# Patient Record
Sex: Male | Born: 1990 | Race: Black or African American | Hispanic: No | Marital: Single | Smoking: Former smoker
Health system: Southern US, Community
[De-identification: ages and names within clinical notes are randomized; demographics above are authoritative.]

## PROBLEM LIST (undated history)

## (undated) DIAGNOSIS — J302 Other seasonal allergic rhinitis: Secondary | ICD-10-CM

## (undated) HISTORY — PX: TONSILLECTOMY: SUR1361

---
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
## 2010-05-03 LAB — CBC
HCT: 43.5 % (ref 39.0–52.0)
MCV: 84.6 fL (ref 78.0–100.0)
Platelets: 394 10*3/uL (ref 150–400)
WBC: 10.9 10*3/uL — ABNORMAL HIGH (ref 4.0–10.5)

## 2010-05-03 LAB — URINALYSIS, ROUTINE W REFLEX MICROSCOPIC
Bilirubin Urine: NEGATIVE
Glucose, UA: NEGATIVE mg/dL
Ketones, ur: NEGATIVE mg/dL
Nitrite: NEGATIVE
pH: 6 (ref 5.0–8.0)

## 2010-05-03 LAB — BASIC METABOLIC PANEL
BUN: 11 mg/dL (ref 6–23)
CO2: 27 mEq/L (ref 19–32)
Chloride: 106 mEq/L (ref 96–112)
Potassium: 4.2 mEq/L (ref 3.5–5.1)

## 2010-09-08 ENCOUNTER — Emergency Department (INDEPENDENT_AMBULATORY_CARE_PROVIDER_SITE_OTHER): Payer: Self-pay

## 2010-09-08 ENCOUNTER — Emergency Department (HOSPITAL_BASED_OUTPATIENT_CLINIC_OR_DEPARTMENT_OTHER)
Admission: EM | Admit: 2010-09-08 | Discharge: 2010-09-08 | Disposition: A | Discharge status: Home / Self Care | Payer: Self-pay | Attending: Emergency Medicine | Admitting: Emergency Medicine

## 2010-09-08 ENCOUNTER — Encounter: Payer: Self-pay | Admitting: *Deleted

## 2010-09-08 DIAGNOSIS — M25539 Pain in unspecified wrist: Secondary | ICD-10-CM | POA: Insufficient documentation

## 2010-09-08 DIAGNOSIS — W219XXA Striking against or struck by unspecified sports equipment, initial encounter: Secondary | ICD-10-CM | POA: Insufficient documentation

## 2010-09-08 DIAGNOSIS — S63509A Unspecified sprain of unspecified wrist, initial encounter: Secondary | ICD-10-CM | POA: Insufficient documentation

## 2010-09-08 DIAGNOSIS — Y9367 Activity, basketball: Secondary | ICD-10-CM | POA: Insufficient documentation

## 2010-09-08 DIAGNOSIS — M7989 Other specified soft tissue disorders: Secondary | ICD-10-CM | POA: Insufficient documentation

## 2010-09-08 DIAGNOSIS — X58XXXA Exposure to other specified factors, initial encounter: Secondary | ICD-10-CM

## 2010-09-08 DIAGNOSIS — S63501A Unspecified sprain of right wrist, initial encounter: Secondary | ICD-10-CM

## 2010-09-08 DIAGNOSIS — J45909 Unspecified asthma, uncomplicated: Secondary | ICD-10-CM | POA: Insufficient documentation

## 2010-09-08 HISTORY — DX: Other seasonal allergic rhinitis: J30.2

## 2010-09-08 MED ORDER — KETOROLAC TROMETHAMINE 60 MG/2ML IM SOLN
60.0000 mg | Freq: Once | INTRAMUSCULAR | Status: AC
  Administered 2010-09-08: 60 mg via INTRAMUSCULAR
  Filled 2010-09-08: qty 2

## 2010-09-08 MED ORDER — NAPROXEN 500 MG PO TABS: 500.0000 mg | ORAL_TABLET | Freq: Two times a day (BID) | ORAL | Status: AC

## 2010-09-08 NOTE — ED Provider Notes (Signed)
History     CSN: [id number] Arrival date & time: 09/08/2010  1:47 PM  Chief Complaint  Patient presents with  . Wrist Pain   HPI Comments: The patient states that he was playing basketball approximately one week ago when the ball hit his hand and caused his wrist to go into hyperdorsiflexion. He admits to mild swelling, pain, worse with range of motion. There is no redness, numbness, weakness. Symptoms are ongoing and minimal improvement with Tylenol has been seen  Patient is a 20 y.o. male presenting with wrist pain. The history is provided by the patient.  Wrist Pain This is a new problem. The current episode started more than 1 week ago. The problem occurs constantly. The problem has not changed since onset.Exacerbated by: Range of motion. Relieved by: Rest, ice, Tylenol. The treatment provided mild relief.    Past Medical History  Diagnosis Date  . Asthma   . Seasonal allergies     Past Surgical History  Procedure Date  . Tonsillectomy     No family history on file.  History  Substance Use Topics  . Smoking status: Current Some Day Smoker  . Smokeless tobacco: Not on file  . Alcohol Use: Yes      Review of Systems  Constitutional: Negative for fever and chills.  Musculoskeletal: Positive for joint swelling and arthralgias.  Skin: Negative for rash.    Physical Exam  BP 145/81  Pulse 98  Temp(Src) 98.2 F (36.8 C) (Oral)  Resp 18  SpO2 100%  Physical Exam  Constitutional: He appears well-developed and well-nourished. No distress.  HENT:  Head: Normocephalic and atraumatic.  Eyes: Conjunctivae are normal. No scleral icterus.  Pulmonary/Chest: Effort normal. No respiratory distress.  Musculoskeletal: Normal range of motion. He exhibits tenderness (Right wrist with tenderness to palpation diffusely, no observable swelling, no redness. Normal flexor and extensor function of the hand on the right. Normal flexor extensor function of the fingers on the right). He  exhibits no edema.  Neurological: He is alert. Coordination normal.       Normal sensation and motor function of the right upper extremity. Normal gait, normal speech.  Skin: Skin is warm and dry. No rash noted. He is not diaphoretic.    ED Course  Procedures  MDM Patient appears well, has normal vital signs, has pain in the right wrist without appreciable swelling. Will x-ray to rule out small fractures. Suspect sprain, will have ice, elevation, immobilization with a Velcro wrist splint.  X-rays as evaluated by myself and the radiologist reveal no signs of fracture.    Vida Roller, MD 09/08/10 [phone]

## 2010-09-08 NOTE — ED Notes (Signed)
Right wrist pain x 1 week. Pain started after playing basketball.

## 2012-11-06 ENCOUNTER — Emergency Department (HOSPITAL_BASED_OUTPATIENT_CLINIC_OR_DEPARTMENT_OTHER): Payer: Self-pay

## 2012-11-06 ENCOUNTER — Emergency Department (HOSPITAL_BASED_OUTPATIENT_CLINIC_OR_DEPARTMENT_OTHER)
Admission: EM | Admit: 2012-11-06 | Discharge: 2012-11-06 | Disposition: A | Discharge status: Home / Self Care | Payer: Self-pay | Attending: Emergency Medicine | Admitting: Emergency Medicine

## 2012-11-06 ENCOUNTER — Encounter (HOSPITAL_BASED_OUTPATIENT_CLINIC_OR_DEPARTMENT_OTHER): Payer: Self-pay | Admitting: Emergency Medicine

## 2012-11-06 DIAGNOSIS — R0781 Pleurodynia: Secondary | ICD-10-CM

## 2012-11-06 DIAGNOSIS — J45909 Unspecified asthma, uncomplicated: Secondary | ICD-10-CM | POA: Insufficient documentation

## 2012-11-06 DIAGNOSIS — R071 Chest pain on breathing: Secondary | ICD-10-CM | POA: Insufficient documentation

## 2012-11-06 DIAGNOSIS — F17200 Nicotine dependence, unspecified, uncomplicated: Secondary | ICD-10-CM | POA: Insufficient documentation

## 2012-11-06 LAB — BASIC METABOLIC PANEL
CO2: 26 mEq/L (ref 19–32)
Glucose, Bld: 98 mg/dL (ref 70–99)
Potassium: 3.6 mEq/L (ref 3.5–5.1)
Sodium: 139 mEq/L (ref 135–145)

## 2012-11-06 LAB — CBC WITH DIFFERENTIAL/PLATELET
Eosinophils Absolute: 0.3 10*3/uL (ref 0.0–0.7)
Eosinophils Relative: 3 % (ref 0–5)
HCT: 39.9 % (ref 39.0–52.0)
Hemoglobin: 13.2 g/dL (ref 13.0–17.0)
Lymphs Abs: 5.3 10*3/uL — ABNORMAL HIGH (ref 0.7–4.0)
MCH: 28.6 pg (ref 26.0–34.0)
MCV: 86.6 fL (ref 78.0–100.0)
Monocytes Relative: 8 % (ref 3–12)
RBC: 4.61 MIL/uL (ref 4.22–5.81)

## 2012-11-06 MED ORDER — KETOROLAC TROMETHAMINE 30 MG/ML IJ SOLN
30.0000 mg | Freq: Once | INTRAMUSCULAR | Status: AC
  Administered 2012-11-06: 30 mg via INTRAVENOUS
  Filled 2012-11-06: qty 1

## 2012-11-06 MED ORDER — SODIUM CHLORIDE 0.9 % IV SOLN
Freq: Once | INTRAVENOUS | Status: AC
  Administered 2012-11-06: 02:00:00 via INTRAVENOUS

## 2012-11-06 MED ORDER — NAPROXEN SODIUM 220 MG PO TABS: 440.0000 mg | ORAL_TABLET | Freq: Two times a day (BID) | ORAL | Status: DC | PRN

## 2012-11-06 MED ORDER — ASPIRIN 81 MG PO CHEW
324.0000 mg | CHEWABLE_TABLET | Freq: Once | ORAL | Status: AC
  Administered 2012-11-06: 324 mg via ORAL
  Filled 2012-11-06: qty 4

## 2012-11-06 MED ORDER — GI COCKTAIL ~~LOC~~
30.0000 mL | Freq: Once | ORAL | Status: AC
  Administered 2012-11-06: 30 mL via ORAL
  Filled 2012-11-06: qty 30

## 2012-11-06 NOTE — ED Provider Notes (Signed)
CSN: [id number]     Arrival date & time 11/06/12  0058 History   First MD Initiated Contact with Patient 11/06/12 0115     Chief Complaint  Patient presents with  . Chest Pain   (Consider location/radiation/quality/duration/timing/severity/associated sxs/prior Treatment) HPI This is a 22 year old male with no significant past medical history apart from asthma and seasonal allergies. He is here with chest pain since yesterday morning. It is located in the left pectoral region. He describes it as sharp and pleuritic. It is worse with activity or deep breathing. There is no associated shortness of breath, diaphoresis or nausea. He is having some epigastric discomfort. He has taken aspirin without relief. He denies fever or, cough or other cold symptoms. He denies leg pain or swelling.  Past Medical History  Diagnosis Date  . Asthma   . Seasonal allergies    Past Surgical History  Procedure Laterality Date  . Tonsillectomy     History reviewed. No pertinent family history. History  Substance Use Topics  . Smoking status: Current Some Day Smoker  . Smokeless tobacco: Not on file  . Alcohol Use: Yes    Review of Systems  All other systems reviewed and are negative.    Allergies  Review of patient's allergies indicates no known allergies.  Home Medications  No current outpatient prescriptions on file. BP 145/76  Pulse 58  Temp(Src) 98.1 F (36.7 C) (Oral)  Resp 18  Ht 5\' 10"  (1.778 m)  Wt 301 lb (136.533 kg)  BMI 43.19 kg/m2  SpO2 100%  Physical Exam General: Well-developed, well-nourished male in no acute distress; appearance consistent with age of record HENT: normocephalic; atraumatic Eyes: pupils equal, round and reactive to light; extraocular muscles intact Neck: supple Heart: regular rate and rhythm; no murmurs, rubs or gallops Lungs: clear to auscultation bilaterally Chest: No chest wall tenderness Abdomen: soft; nondistended; epigastric tenderness; no masses  or hepatosplenomegaly; bowel sounds present Extremities: No deformity; full range of motion; pulses normal; no edema Neurologic: Awake, alert and oriented; motor function intact in all extremities and symmetric; no facial droop Skin: Warm and dry Psychiatric: Normal mood and affect    ED Course  Procedures (including critical care time)  MDM   Nursing notes and vitals signs, including pulse oximetry, reviewed.  Summary of this visit's results, reviewed by myself:  Labs:  Results for orders placed during the hospital encounter of 11/06/12 (from the past 24 hour(s))  TROPONIN I     Status: None   Collection Time    11/06/12  1:49 AM      Result Value Range   Troponin I <0.30  <0.30 ng/mL  D-DIMER, QUANTITATIVE     Status: None   Collection Time    11/06/12  1:49 AM      Result Value Range   D-Dimer, Quant <0.27  0.00 - 0.48 ug/mL-FEU  BASIC METABOLIC PANEL     Status: None   Collection Time    11/06/12  1:49 AM      Result Value Range   Sodium 139  135 - 145 mEq/L   Potassium 3.6  3.5 - 5.1 mEq/L   Chloride 102  96 - 112 mEq/L   CO2 26  19 - 32 mEq/L   Glucose, Bld 98  70 - 99 mg/dL   BUN 10  6 - 23 mg/dL   Creatinine, Ser 4.09  0.50 - 1.35 mg/dL   Calcium 9.7  8.4 - 81.1 mg/dL   GFR calc non  Af Amer >90  >90 mL/min   GFR calc Af Amer >90  >90 mL/min  CBC WITH DIFFERENTIAL     Status: Abnormal   Collection Time    11/06/12  1:49 AM      Result Value Range   WBC 10.7 (*) 4.0 - 10.5 K/uL   RBC 4.61  4.22 - 5.81 MIL/uL   Hemoglobin 13.2  13.0 - 17.0 g/dL   HCT 95.6  21.3 - 08.6 %   MCV 86.6  78.0 - 100.0 fL   MCH 28.6  26.0 - 34.0 pg   MCHC 33.1  30.0 - 36.0 g/dL   RDW 57.8  46.9 - 62.9 %   Platelets 331  150 - 400 K/uL   Neutrophils Relative % 38 (*) 43 - 77 %   Neutro Abs 4.1  1.7 - 7.7 K/uL   Lymphocytes Relative 50 (*) 12 - 46 %   Lymphs Abs 5.3 (*) 0.7 - 4.0 K/uL   Monocytes Relative 8  3 - 12 %   Monocytes Absolute 0.9  0.1 - 1.0 K/uL   Eosinophils  Relative 3  0 - 5 %   Eosinophils Absolute 0.3  0.0 - 0.7 K/uL   Basophils Relative 0  0 - 1 %   Basophils Absolute 0.0  0.0 - 0.1 K/uL    Imaging Studies: Dg Chest 2 View  11/06/2012   *RADIOLOGY REPORT*  Clinical Data: Left chest pain.  CHEST - 2 VIEW  Comparison: None available at time of study interpretation.  Findings: The cardiomediastinal silhouette is unremarkable.  The lungs are clear without pleural effusions or focal consolidations. The pulmonary vasculature is unremarkable.   Trachea projects midline and there is no pneumothorax.  The included soft tissue planes and osseous structures are unremarkable.  IMPRESSION: No acute cardiopulmonary process.   Original Report Authenticated By: Awilda Metro      EKG Interpretation:  Date & Time: 11/06/2012 1:13 AM  Rate: 56  Rhythm: sinus bradycardia and sinus arrhythmia  QRS Axis: normal  Intervals: normal  ST/T Wave abnormalities: normal  Conduction Disutrbances:none  Narrative Interpretation:   Old EKG Reviewed: none available  3:00 AM Epigastric discomfort not relieved by GI cocktail. Chest pain improved with IV Toradol. Patient has low risk factors for cardiac chest pain. History and exam were consistent with pleuritic chest pain.  Carlisle Beers Kamarrion Stfort, MD 11/06/12 0300

## 2012-11-06 NOTE — ED Notes (Signed)
C/o chest pain since Monday morning, pains started at work. Pain increases with deep breath, denies fever or cough.

## 2012-11-06 NOTE — ED Notes (Signed)
Patient transported to X-ray ambulatory with tech. 

## 2013-08-09 ENCOUNTER — Encounter (HOSPITAL_BASED_OUTPATIENT_CLINIC_OR_DEPARTMENT_OTHER): Payer: Self-pay | Admitting: Emergency Medicine

## 2013-08-09 ENCOUNTER — Emergency Department (HOSPITAL_BASED_OUTPATIENT_CLINIC_OR_DEPARTMENT_OTHER)
Admission: EM | Admit: 2013-08-09 | Discharge: 2013-08-09 | Disposition: A | Discharge status: Home / Self Care | Payer: Self-pay | Attending: Emergency Medicine | Admitting: Emergency Medicine

## 2013-08-09 DIAGNOSIS — F17200 Nicotine dependence, unspecified, uncomplicated: Secondary | ICD-10-CM | POA: Insufficient documentation

## 2013-08-09 DIAGNOSIS — M62830 Muscle spasm of back: Secondary | ICD-10-CM

## 2013-08-09 DIAGNOSIS — M5380 Other specified dorsopathies, site unspecified: Secondary | ICD-10-CM | POA: Insufficient documentation

## 2013-08-09 DIAGNOSIS — J45909 Unspecified asthma, uncomplicated: Secondary | ICD-10-CM | POA: Insufficient documentation

## 2013-08-09 DIAGNOSIS — Z791 Long term (current) use of non-steroidal anti-inflammatories (NSAID): Secondary | ICD-10-CM | POA: Insufficient documentation

## 2013-08-09 MED ORDER — IBUPROFEN 800 MG PO TABS: 800.0000 mg | ORAL_TABLET | Freq: Three times a day (TID) | ORAL | Status: DC

## 2013-08-09 MED ORDER — CYCLOBENZAPRINE HCL 10 MG PO TABS: 10.0000 mg | ORAL_TABLET | Freq: Two times a day (BID) | ORAL | Status: DC | PRN

## 2013-08-09 MED ORDER — HYDROCODONE-ACETAMINOPHEN 5-325 MG PO TABS: 1.0000 | ORAL_TABLET | ORAL | Status: DC | PRN

## 2013-08-09 NOTE — Discharge Instructions (Signed)
Back Injury Prevention Back injuries can be extremely painful and difficult to heal. After having one back injury, you are much more likely to experience another later on. It is important to learn how to avoid injuring or re-injuring your back. The following tips can help you to prevent a back injury. PHYSICAL FITNESS  Exercise regularly and try to develop good tone in your abdominal muscles. Your abdominal muscles provide a lot of the support needed by your back.  Do aerobic exercises (walking, jogging, biking, swimming) regularly.  Do exercises that increase balance and strength (tai chi, yoga) regularly. This can decrease your risk of falling and injuring your back.  Stretch before and after exercising.  Maintain a healthy weight. The more you weigh, the more stress is placed on your back. For every pound of weight, 10 times that amount of pressure is placed on the back. DIET  Talk to your caregiver about how much calcium and vitamin D you need per day. These nutrients help to prevent weakening of the bones (osteoporosis). Osteoporosis can cause broken (fractured) bones that lead to back pain.  Include good sources of calcium in your diet, such as dairy products, green, leafy vegetables, and products with calcium added (fortified).  Include good sources of vitamin D in your diet, such as milk and foods that are fortified with vitamin D.  Consider taking a nutritional supplement or a multivitamin if needed.  Stop smoking if you smoke. POSTURE  Sit and stand up straight. Avoid leaning forward when you sit or hunching over when you stand.  Choose chairs with good low back (lumbar) support.  If you work at a desk, sit close to your work so you do not need to lean over. Keep your chin tucked in. Keep your neck drawn back and elbows bent at a right angle. Your arms should look like the letter "L."  Sit high and close to the steering wheel when you drive. Add a lumbar support to your car  seat if needed.  Avoid sitting or standing in one position for too long. Take breaks to get up, stretch, and walk around at least once every hour. Take breaks if you are driving for long periods of time.  Sleep on your side with your knees slightly bent, or sleep on your back with a pillow under your knees. Do not sleep on your stomach. LIFTING, TWISTING, AND REACHING  Avoid heavy lifting, especially repetitive lifting. If you must do heavy lifting:  Stretch before lifting.  Work slowly.  Rest between lifts.  Use carts and dollies to move objects when possible.  Make several small trips instead of carrying 1 heavy load.  Ask for help when you need it.  Ask for help when moving big, awkward objects.  Follow these steps when lifting:  Stand with your feet shoulder-width apart.  Get as close to the object as you can. Do not try to pick up heavy objects that are far from your body.  Use handles or lifting straps if they are available.  Bend at your knees. Squat down, but keep your heels off the floor.  Keep your shoulders pulled back, your chin tucked in, and your back straight.  Lift the object slowly, tightening the muscles in your legs, abdomen, and buttocks. Keep the object as close to the center of your body as possible.  When you put a load down, use these same guidelines in reverse.  Do not:  Lift the object above your waist.  Twist at the waist while lifting or carrying a load. Move your feet if you need to turn, not your waist.  Bend over without bending at your knees.  Avoid reaching over your head, across a table, or for an object on a high surface. OTHER TIPS  Avoid wet floors and keep sidewalks clear of ice to prevent falls.  Do not sleep on a mattress that is too soft or too hard.  Keep items that are used frequently within easy reach.  Put heavier objects on shelves at waist level and lighter objects on lower or higher shelves.  Find ways to  decrease your stress, such as exercise, massage, or relaxation techniques. Stress can build up in your muscles. Tense muscles are more vulnerable to injury.  Seek treatment for depression or anxiety if needed. These conditions can increase your risk of developing back pain. SEEK MEDICAL CARE IF:  You injure your back.  You have questions about diet, exercise, or other ways to prevent back injuries. MAKE SURE YOU:  Understand these instructions.  Will watch your condition.  Will get help right away if you are not doing well or get worse. Document Released: 02/18/2004 Document Revised: 04/04/2011 Document Reviewed: 02/21/2011 [employer] Patient Information 2015 [employer]. This information is not intended to replace advice given to you by your health care provider. Make sure you discuss any questions you have with your health care provider.  Back Pain, Adult Low back pain is very common. About 1 in 5 people have back pain.The cause of low back pain is rarely dangerous. The pain often gets better over time.About half of people with a sudden onset of back pain feel better in just 2 weeks. About 8 in 10 people feel better by 6 weeks.  CAUSES Some common causes of back pain include:  Strain of the muscles or ligaments supporting the spine.  Wear and tear (degeneration) of the spinal discs.  Arthritis.  Direct injury to the back. DIAGNOSIS Most of the time, the direct cause of low back pain is not known.However, back pain can be treated effectively even when the exact cause of the pain is unknown.Answering your caregiver's questions about your overall health and symptoms is one of the most accurate ways to make sure the cause of your pain is not dangerous. If your caregiver needs more information, he or she may order lab work or imaging tests (X-rays or MRIs).However, even if imaging tests show changes in your back, this usually does not require surgery. HOME CARE INSTRUCTIONS For  many people, back pain returns.Since low back pain is rarely dangerous, it is often a condition that people can learn to manageon their own.   Remain active. It is stressful on the back to sit or stand in one place. Do not sit, drive, or stand in one place for more than 30 minutes at a time. Take short walks on level surfaces as soon as pain allows.Try to increase the length of time you walk each day.  Do not stay in bed.Resting more than 1 or 2 days can delay your recovery.  Do not avoid exercise or work.Your body is made to move.It is not dangerous to be active, even though your back may hurt.Your back will likely heal faster if you return to being active before your pain is gone.  Pay attention to your body when you bend and lift. Many people have less discomfortwhen lifting if they bend their knees, keep the load close to their bodies,and   twisting. Often, the most comfortable positions are those that put less stress on your recovering back.  Find a comfortable position to sleep. Use a firm mattress and lie on your side with your knees slightly bent. If you lie on your back, put a pillow under your knees.  Only take over-the-counter or prescription medicines as directed by your caregiver. Over-the-counter medicines to reduce pain and inflammation are often the most helpful.Your caregiver may prescribe muscle relaxant drugs.These medicines help dull your pain so you can more quickly return to your normal activities and healthy exercise.  Put ice on the injured area.  Put ice in a plastic bag.  Place a towel between your skin and the bag.  Leave the ice on for 15-20 minutes, 03-04 times a day for the first 2 to 3 days. After that, ice and heat may be alternated to reduce pain and spasms.  Ask your caregiver about trying back exercises and gentle massage. This may be of some benefit.  Avoid feeling anxious or stressed.Stress increases muscle tension and can worsen back  pain.It is important to recognize when you are anxious or stressed and learn ways to manage it.Exercise is a great option. SEEK MEDICAL CARE IF:  You have pain that is not relieved with rest or medicine.  You have pain that does not improve in 1 week.  You have new symptoms.  You are generally not feeling well. SEEK IMMEDIATE MEDICAL CARE IF:   You have pain that radiates from your back into your legs.  You develop new bowel or bladder control problems.  You have unusual weakness or numbness in your arms or legs.  You develop nausea or vomiting.  You develop abdominal pain.  You feel faint. Document Released: 01/10/2005 Document Revised: 07/12/2011 Document Reviewed: 05/31/2010 [facility] Patient Information 2015 [place], [place]. This information is not intended to replace advice given to you by your health care provider. Make sure you discuss any questions you have with your health care provider. Muscle Cramps and Spasms Muscle cramps and spasms occur when a muscle or muscles tighten and you have no control over this tightening (involuntary muscle contraction). They are a common problem and can develop in any muscle. The most common place is in the calf muscles of the leg. Both muscle cramps and muscle spasms are involuntary muscle contractions, but they also have differences:   Muscle cramps are sporadic and painful. They may last a few seconds to a quarter of an hour. Muscle cramps are often more forceful and last longer than muscle spasms.  Muscle spasms may or may not be painful. They may also last just a few seconds or much longer. CAUSES  It is uncommon for cramps or spasms to be due to a serious underlying problem. In many cases, the cause of cramps or spasms is unknown. Some common causes are:   Overexertion.   Overuse from repetitive motions (doing the same thing over and over).   Remaining in a certain position for a long period of time.   Improper preparation,  form, or technique while performing a sport or activity.   Dehydration.   Injury.   Side effects of some medicines.   Abnormally low levels of the salts and ions in your blood (electrolytes), especially potassium and calcium. This could happen if you are taking water pills (diuretics) or you are pregnant.  Some underlying medical problems can make it more likely to develop cramps or spasms. These include, but are not limited to:  Diabetes.   Parkinson disease.   Hormone disorders, such as thyroid problems.   Alcohol abuse.   Diseases specific to muscles, joints, and bones.   Blood vessel disease where not enough blood is getting to the muscles.  HOME CARE INSTRUCTIONS   Stay well hydrated. Drink enough water and fluids to keep your urine clear or pale yellow.  It may be helpful to massage, stretch, and relax the affected muscle.  For tight or tense muscles, use a warm towel, heating pad, or hot shower water directed to the affected area.  If you are sore or have pain after a cramp or spasm, applying ice to the affected area may relieve discomfort.  Put ice in a plastic bag.  Place a towel between your skin and the bag.  Leave the ice on for 15-20 minutes, 03-04 times a day.  Medicines used to treat a known cause of cramps or spasms may help reduce their frequency or severity. Only take over-the-counter or prescription medicines as directed by your caregiver. SEEK MEDICAL CARE IF:  Your cramps or spasms get more severe, more frequent, or do not improve over time.  MAKE SURE YOU:   Understand these instructions.  Will watch your condition.  Will get help right away if you are not doing well or get worse. Document Released: 07/02/2001 Document Revised: 05/07/2012 Document Reviewed: 12/28/2011 [facility] Patient Information 2015 [place], [place]. This information is not intended to replace advice given to you by your health care provider. Make sure you discuss  any questions you have with your health care provider.

## 2013-08-09 NOTE — ED Provider Notes (Signed)
CSN: [id number]     Arrival date & time 08/09/13  1315 History   First MD Initiated Contact with Patient 08/09/13 1319     Chief Complaint  Patient presents with  . Back Pain     (Consider location/radiation/quality/duration/timing/severity/associated sxs/prior Treatment) Patient is a 23 y.o. male presenting with back pain. The history is provided by the patient. No language interpreter was used.  Back Pain Location:  Thoracic spine Quality:  Aching, stabbing and cramping Radiates to:  Does not radiate Duration:  2 days Progression:  Worsening Relieved by:  Being still and bed rest Worsened by:  Bending, movement, twisting and standing Associated symptoms: no abdominal pain, no bladder incontinence, no bowel incontinence, no chest pain, no dysuria, no fever, no leg pain and no paresthesias   Associated symptoms comment:  Mid-back pain bilateral as well as midline, for the past 2 days without known injury. He stands for his 12-hours shifts at work, but does no heavy lifting. He reports the pain is worse with movements, better with rest. This morning the pain became sharp, shooting pain that was episodic.    Past Medical History  Diagnosis Date  . Asthma   . Seasonal allergies    Past Surgical History  Procedure Laterality Date  . Tonsillectomy     No family history on file. History  Substance Use Topics  . Smoking status: Current Some Day Smoker  . Smokeless tobacco: Not on file  . Alcohol Use: Yes    Review of Systems  Constitutional: Negative for fever and chills.  Respiratory: Negative.   Cardiovascular: Negative.  Negative for chest pain.  Gastrointestinal: Negative.  Negative for vomiting, abdominal pain and bowel incontinence.  Genitourinary: Negative for bladder incontinence, dysuria and flank pain.  Musculoskeletal: Positive for back pain.       See HPI  Skin: Negative.   Neurological: Negative.  Negative for paresthesias.      Allergies  Review of  patient's allergies indicates no known allergies.  Home Medications   Prior to Admission medications   Medication Sig Start Date End Date Taking? Authorizing Provider  ibuprofen (ADVIL,MOTRIN) 400 MG tablet Take 400 mg by mouth every 6 (six) hours as needed.   Yes Historical Provider, MD  naproxen sodium (ALEVE) 220 MG tablet Take 2 tablets (440 mg total) by mouth 2 (two) times daily as needed (for chest wall pain). 11/06/12   John L Molpus, MD   BP 147/69  Pulse 69  Temp(Src) 98.6 F (37 C) (Oral)  Resp 18  Ht 5\' 10"  (1.778 m)  Wt 300 lb (136.079 kg)  BMI 43.05 kg/m2  SpO2 100% Physical Exam  Constitutional: He is oriented to person, place, and time. He appears well-developed and well-nourished.  Neck: Normal range of motion.  Pulmonary/Chest: Effort normal.  Abdominal: Soft. He exhibits no mass. There is no tenderness.  Musculoskeletal: Normal range of motion.  Bilateral and midline lower thoracic tenderness without swelling, discoloration.   Neurological: He is alert and oriented to person, place, and time. He has normal reflexes. No sensory deficit.  Skin: Skin is warm and dry.  Psychiatric: He has a normal mood and affect.    ED Course  Procedures (including critical care time) Labs Review Labs Reviewed - No data to display  Imaging Review No results found.   EKG Interpretation None      MDM   Final diagnoses:  None    1. Muscle spasm 2. Back pain  Well appearing patient with  presentation of back pain following muscular strain --> spasm pain.    Arnoldo Hooker, PA-C 08/09/13 1402

## 2013-08-09 NOTE — ED Notes (Signed)
Pt. Reports mid back pain with no injury.  Pt. Reports feeling pain.  No trouble with urinating and normal bowel movements.

## 2013-08-09 NOTE — ED Provider Notes (Signed)
Medical screening examination/treatment/procedure(s) were conducted as a shared visit with non-physician practitioner(s) or resident  and myself.  I personally evaluated the patient during the encounter and agree with the findings.   I have personally reviewed any xrays and/ or EKG's with the provider and I agree with interpretation.   Patient presents with lower back ache for the past 2 days. No injuries recall.Patient denies urinary or bowel changes, active cancer, extremity weakness, IVDU, fevers, immunosuppression or significant trauma. On exam patient has paraspinal proximal lumbar tenderness no significant midline tenderness, 5+ strength in lower extremities with flexion extension of hips knees and great toes, sensation intact bilateral palpation. Patient denies bowel or bladder changes or cell anesthesia. Currently musculoskeletal and followup and supportive care discussed.  Back strain/ spasm   Stephen SkeensJoshua M Jerrine Urschel, MD 08/09/13 [phone]

## 2013-12-09 ENCOUNTER — Emergency Department (HOSPITAL_BASED_OUTPATIENT_CLINIC_OR_DEPARTMENT_OTHER): Payer: Self-pay

## 2013-12-09 ENCOUNTER — Emergency Department (HOSPITAL_BASED_OUTPATIENT_CLINIC_OR_DEPARTMENT_OTHER)
Admission: EM | Admit: 2013-12-09 | Discharge: 2013-12-09 | Disposition: A | Discharge status: Home / Self Care | Payer: Self-pay | Attending: Emergency Medicine | Admitting: Emergency Medicine

## 2013-12-09 ENCOUNTER — Encounter (HOSPITAL_BASED_OUTPATIENT_CLINIC_OR_DEPARTMENT_OTHER): Payer: Self-pay

## 2013-12-09 DIAGNOSIS — J45909 Unspecified asthma, uncomplicated: Secondary | ICD-10-CM | POA: Insufficient documentation

## 2013-12-09 DIAGNOSIS — M549 Dorsalgia, unspecified: Secondary | ICD-10-CM | POA: Insufficient documentation

## 2013-12-09 DIAGNOSIS — R51 Headache: Secondary | ICD-10-CM | POA: Insufficient documentation

## 2013-12-09 DIAGNOSIS — R079 Chest pain, unspecified: Secondary | ICD-10-CM | POA: Insufficient documentation

## 2013-12-09 LAB — BASIC METABOLIC PANEL
Anion gap: 10 (ref 5–15)
BUN: 6 mg/dL (ref 6–23)
CO2: 28 meq/L (ref 19–32)
Calcium: 10 mg/dL (ref 8.4–10.5)
Chloride: 106 mEq/L (ref 96–112)
Creatinine, Ser: 0.8 mg/dL (ref 0.50–1.35)
GFR calc Af Amer: >90 mL/min (ref >90)
GLUCOSE: 92 mg/dL (ref 70–99)
POTASSIUM: 4.4 meq/L (ref 3.7–5.3)
Sodium: 144 mEq/L (ref 137–147)

## 2013-12-09 LAB — CBC WITH DIFFERENTIAL/PLATELET
BASOS ABS: 0 10*3/uL (ref 0.0–0.1)
Basophils Relative: 0 % (ref 0–1)
Eosinophils Absolute: 0.2 10*3/uL (ref 0.0–0.7)
Eosinophils Relative: 2 % (ref 0–5)
HCT: 42.9 % (ref 39.0–52.0)
Hemoglobin: 14.3 g/dL (ref 13.0–17.0)
Lymphocytes Relative: 50 % — ABNORMAL HIGH (ref 12–46)
Lymphs Abs: 4.9 10*3/uL — ABNORMAL HIGH (ref 0.7–4.0)
MCH: 28.8 pg (ref 26.0–34.0)
MCHC: 33.3 g/dL (ref 30.0–36.0)
MCV: 86.5 fL (ref 78.0–100.0)
Monocytes Absolute: 0.7 10*3/uL (ref 0.1–1.0)
Monocytes Relative: 8 % (ref 3–12)
NEUTROS ABS: 3.9 10*3/uL (ref 1.7–7.7)
NEUTROS PCT: 40 % — AB (ref 43–77)
PLATELETS: 330 10*3/uL (ref 150–400)
RBC: 4.96 MIL/uL (ref 4.22–5.81)
RDW: 13.8 % (ref 11.5–15.5)
WBC: 9.7 10*3/uL (ref 4.0–10.5)

## 2013-12-09 LAB — TROPONIN I

## 2013-12-09 MED ORDER — NAPROXEN 500 MG PO TABS: 500.0000 mg | ORAL_TABLET | Freq: Two times a day (BID) | ORAL | Status: DC

## 2013-12-09 MED ORDER — KETOROLAC TROMETHAMINE 30 MG/ML IJ SOLN
30.0000 mg | Freq: Once | INTRAMUSCULAR | Status: AC
  Administered 2013-12-09: 30 mg via INTRAMUSCULAR
  Filled 2013-12-09: qty 1

## 2013-12-09 NOTE — ED Provider Notes (Signed)
CSN: [id number]     Arrival date & time 12/09/13  1249 History   First MD Initiated Contact with Patient 12/09/13 1303     Chief Complaint  Patient presents with  . Back Pain  . Chest Pain   HPI  Patient is a 23 y.o. Male who presents to the ED with chest pain x 3 days.  Patient states that he has had sharp intermittent chest pains which last a maximum of 15 minutes at a time.  These pains come at rest and with activity.  Patient has never had these pains before.  There seem to be no aggravating factors that the patient is aware of.  Patient has tried aspirin with some relief.  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  He has not traveled recently, has had no surgeries, immobilization, history of PE or DVT, or history of active cancer that he is aware of.  Patient told the triage nurse that he was also experiencing back pain, but he did not mention back pain to me as a complaint.    Past Medical History  Diagnosis Date  . Asthma   . Seasonal allergies    Past Surgical History  Procedure Laterality Date  . Tonsillectomy     No family history on file. History  Substance Use Topics  . Smoking status: Current Some Day Smoker  . Smokeless tobacco: Not on file  . Alcohol Use: Yes    Review of Systems  Constitutional: Negative for fever, chills and fatigue.  Respiratory: Negative for cough, chest tightness and shortness of breath.   Cardiovascular: Positive for chest pain. Negative for palpitations and leg swelling.  Gastrointestinal: Negative for nausea, vomiting, abdominal pain, diarrhea and constipation.  Neurological: Positive for headaches. Negative for syncope, weakness and numbness.  All other systems reviewed and are negative.     Allergies  Review of patient's allergies indicates no known allergies.  Home Medications   Prior to Admission medications   Medication Sig Start Date End Date Taking? Authorizing Provider  ibuprofen (ADVIL,MOTRIN) 400 MG tablet  Take 400 mg by mouth every 6 (six) hours as needed.    Historical Provider, MD  naproxen sodium (ALEVE) 220 MG tablet Take 2 tablets (440 mg total) by mouth 2 (two) times daily as needed (for chest wall pain). 11/06/12   John L Molpus, MD   BP 168/86 mmHg  Pulse 57  Temp(Src) 98.8 F (37.1 C) (Oral)  Resp 18  Ht 5\' 11"  (1.803 m)  Wt 295 lb (133.811 kg)  BMI 41.16 kg/m2  SpO2 100% Physical Exam  Constitutional: He is oriented to person, place, and time. He appears well-developed and well-nourished. No distress.  HENT:  Head: Normocephalic and atraumatic.  Mouth/Throat: Oropharynx is clear and moist. No oropharyngeal exudate.  Eyes: Conjunctivae and EOM are normal. Pupils are equal, round, and reactive to light. No scleral icterus.  Neck: Normal range of motion. Neck supple. No JVD present. No thyromegaly present.  Cardiovascular: Normal rate, regular rhythm, normal heart sounds and intact distal pulses.  Exam reveals no gallop and no friction rub.   No murmur heard. Pulmonary/Chest: Effort normal and breath sounds normal. No respiratory distress. He has no wheezes. He has no rales. He exhibits tenderness (Chest pain reproducible on right lateral precordium).  Abdominal: Soft. Bowel sounds are normal. He exhibits no distension and no mass. There is no tenderness. There is no rebound and no guarding.  Musculoskeletal: Normal range of motion.  Lymphadenopathy:  He has no cervical adenopathy.  Neurological: He is alert and oriented to person, place, and time. He has normal strength. No cranial nerve deficit or sensory deficit. Coordination normal.  Skin: Skin is warm and dry. He is not diaphoretic.  Psychiatric: He has a normal mood and affect. His behavior is normal. Judgment and thought content normal.  Nursing note and vitals reviewed.   ED Course  Procedures (including critical care time) Labs Review Labs Reviewed  CBC WITH DIFFERENTIAL - Abnormal; Notable for the following:     Neutrophils Relative % 40 (*)    Lymphocytes Relative 50 (*)    Lymphs Abs 4.9 (*)    All other components within normal limits  BASIC METABOLIC PANEL  TROPONIN I    Imaging Review Dg Chest 2 View  12/09/2013   CLINICAL DATA:  Chest pain for 2 days.  Short of breath.  EXAM: CHEST  2 VIEW  COMPARISON:  Radiograph 11/06/2012  FINDINGS: Normal mediastinum and cardiac silhouette. Normal pulmonary vasculature. No evidence of effusion, infiltrate, or pneumothorax. No acute bony abnormality.  IMPRESSION: Normal chest radiograph   Electronically Signed   By: Genevive BiStewart  Edmunds M.D.   On: 12/09/2013 13:53     EKG Interpretation   Date/Time:  Monday December 09 2013 13:47:22 EST Ventricular Rate:  61 PR Interval:  186 QRS Duration: 108 QT Interval:  376 QTC Calculation: 378 R Axis:   89 Text Interpretation:  Normal sinus rhythm with sinus arrhythmia Normal ECG  No significant change since last tracing Confirmed by Bebe ShaggyWICKLINE  MD, Dorinda HillNALD  [phone]([id number]) on 12/09/2013 1:50:30 PM      MDM   Final diagnoses:  Chest pain   Patient is a 23 y.o. Male who presents to the ED with chest pain x 3 days.  Physical exam reveals chest tenderness which reproduces chest pain.  EKG unremarkable.  CXR is negative.  CBC reveals mild lymphocytosis.  BMP unremarkable.  Troponin is negative.  Patient is PERC negative and is not tachycardic, tachypnic, or hypoxic so doubt PE.  Patient has a HEART score of 1.  Suspect that this is likely musculoskeletal in nature.  Will discharge home with antiinflammatory medications.  Patient discussed with Dr. Bebe ShaggyWickline who agrees with the above plan and discharge.  Patient to return for worsening shortness of breath, chest pain, or any other concerning symptoms.  Patient is stable for discharge.      Eben Burowourtney A Forcucci, PA-C 12/09/13 1452  Joya Gaskinsonald W Wickline, MD 12/09/13 [phone]

## 2013-12-09 NOTE — Discharge Instructions (Signed)
Chest Wall Pain °Chest wall pain is pain in or around the bones and muscles of your chest. It may take up to 6 weeks to get better. It may take longer if you must stay physically active in your work and activities.  °CAUSES  °Chest wall pain may happen on its own. However, it may be caused by: °· A viral illness like the flu. °· Injury. °· Coughing. °· Exercise. °· Arthritis. °· Fibromyalgia. °· Shingles. °HOME CARE INSTRUCTIONS  °· Avoid overtiring physical activity. Try not to strain or perform activities that cause pain. This includes any activities using your chest or your abdominal and side muscles, especially if heavy weights are used. °· Put ice on the sore area. °¨ Put ice in a plastic bag. °¨ Place a towel between your skin and the bag. °¨ Leave the ice on for 15-20 minutes per hour while awake for the first 2 days. °· Only take over-the-counter or prescription medicines for pain, discomfort, or fever as directed by your caregiver. °SEEK IMMEDIATE MEDICAL CARE IF:  °· Your pain increases, or you are very uncomfortable. °· You have a fever. °· Your chest pain becomes worse. °· You have new, unexplained symptoms. °· You have nausea or vomiting. °· You feel sweaty or lightheaded. °· You have a cough with phlegm (sputum), or you cough up blood. °MAKE SURE YOU:  °· Understand these instructions. °· Will watch your condition. °· Will get help right away if you are not doing well or get worse. °Document Released: 01/10/2005 Document Revised: 04/04/2011 Document Reviewed: 09/06/2010 °ExitCare® Patient Information ©2015 [employer]. This information is not intended to replace advice given to you by your health care provider. Make sure you discuss any questions you have with your health care provider. ° ° °Emergency Department Resource Guide °1) Find a Doctor and Pay Out of Pocket °Although you won't have to find out who is covered by your insurance plan, it is a good idea to ask around and get recommendations. You  will then need to call the office and see if the doctor you have chosen will accept you as a new patient and what types of options they offer for patients who are self-pay. Some doctors offer discounts or will set up payment plans for their patients who do not have insurance, but you will need to ask so you aren't surprised when you get to your appointment. ° °2) Contact Your Local Health Department °Not all health departments have doctors that can see patients for sick visits, but many do, so it is worth a call to see if yours does. If you don't know where your local health department is, you can check in your phone book. The CDC also has a tool to help you locate your state's health department, and many state websites also have listings of all of their local health departments. ° °3) Find a Walk-in Clinic °If your illness is not likely to be very severe or complicated, you may want to try a walk in clinic. These are popping up all over the country in pharmacies, drugstores, and shopping centers. They're usually staffed by nurse practitioners or physician assistants that have been trained to treat common illnesses and complaints. They're usually fairly quick and inexpensive. However, if you have serious medical issues or chronic medical problems, these are probably not your best option. ° °No Primary Care Doctor: °- Call [facility] at  832-8000 - they can help you locate a primary care doctor that    accepts your insurance, provides certain services, etc. °- Physician Referral Service- 1-800-533-3463 ° °Chronic Pain Problems: °Organization         Address  Phone   Notes  °[facility]  (336) 297-2271 Patients need to be referred by their primary care doctor.  ° °Medication Assistance: °Organization         Address  Phone   Notes  °[place] [facility] [address] °[place], [place] [address] (336) 641-8030 --Must be a resident of [place] °-- Must  have NO insurance coverage whatsoever (no Medicaid/ Medicare, etc.) °-- The pt. MUST have a primary care doctor that directs their care regularly and follows them in the community °  °[facility]  (866) 331-1348   °[employer]  (888) 892-1162   ° °Agencies that provide inexpensive medical care: °Organization         Address  Phone   Notes  °[facility]  (336) 832-8035   °[facility]    (336) 832-7272   °[facility] [address] °[place], [place] [address] (336) 832-4777   °[facility] [address], °[place] (336) 271-4999   °[facility]    (336) 373-0678   °[facility]    (336) 272-1050   °[facility] ° [address], [place] Phone:  (336) 832-4444, Fax:  (336) 832-4440 Hours of Operation:  9 am - 6 pm, M-F.  Also accepts Medicaid/Medicare and self-pay.  °[facility] ° [address], [place] Phone: (336) 832-3150, Fax: (336) 832-3151. Hours of Operation:  8:30 am - 5:30 pm, M-F.  Also accepts Medicaid and self-pay.  °[facility] [place] [address], [place] Phone: (336) 878-6027   °[facility] [address], [place], [place] (336)723-1848, Ext. 123 Mondays & Thursdays: 7-9 AM.  First 15 patients are seen on a first come, first serve basis. °  ° °Medicaid-accepting [place] Providers: ° °Organization         Address  Phone   Notes  °[facility] [address], [place] (336) 641-2100 Also accepts self-pay patients.  °[facility] [address], [place] ° (336) 856-9996   °[facility] [address], [place] (336) 288-8857   °[facility] [address] [place] [address], [place] (336) 299-7000   °Veita Bland [address], [place]  ° (336) 373-1557 Only accepts [place] Access Medicaid patients after  they have their name applied to their card.  ° °Self-Pay (no insurance) in [place]: ° °Organization         Address  Phone   Notes  °Sickle Cell Patients, [facility] [address], [place] (336) 832-1970   °[facility] [address], [place] (336) 832-4400   °[facility] [place] ° [address], [place] (336) 992-4800   °[facility]/Dr. Osei-Bonsu ° [address] [place] [address], [place] or [address], [place] (336) 841-8500 Phone number for both [place] and [place] locations is the same.  °[facility] [address], [place] (336) 299-0000   °[facility] [address] [place] [address],  or [address] (336) 852-7530 °(336) 878-2260   °[facility] [address], [place] (336) 350-1642, phone; (336) 294-5005, fax Sees patients 1st and 3rd Saturday of every month.  Must not qualify for public or private insurance (i.e. Medicaid, Medicare, [place] Health Choice, Veterans' Benefits) • Household income should be no more than 200% of the poverty level •The clinic cannot treat you if you are pregnant or think you are pregnant • Sexually transmitted diseases are not treated at the clinic.  ° ° °Dental Care: °Organization         Address  Phone  Notes  °[facility] [address], [place] (336) 641-6152 Accepts children up to age 21 who are enrolled in Medicaid or [place] Health Choice; pregnant women with a Medicaid card; and children who have applied for Medicaid or [place] Health Choice, but were declined, whose parents can pay a reduced fee at time of service.  °[facility] [place]  [address], [place] (336) 641-7733 Accepts children up to age 21 who are enrolled in Medicaid or [place] Health Choice; pregnant women with a Medicaid card; and children who  have applied for Medicaid or [place] Health Choice, but were declined, whose parents can pay a reduced fee at time of service.  °[facility] ° [address], [place] (336) 641-4533 Patients are seen by appointment only. Walk-ins are not accepted. [facility] will see patients 18 years of age and older. °Monday - Tuesday (8am-5pm) °Most Wednesdays (8:30-5pm) °$30 per visit, cash only  °[facility] ° [address], [place] (336) 641-4533 Patients are seen by appointment only. Walk-ins are not accepted. [facility] will see patients 18 years of age and older. °One Wednesday Evening (Monthly: Volunteer Based).  $30 per visit, cash only  °[facility]  (919) 537-3737 for adults; Children under age 4, call Graduate Pediatric Dentistry at (919) [phone]. Children aged 4-14, please call (919) [phone] to request a pediatric application. ° Dental services are provided in all areas of dental care including fillings, crowns and bridges, complete and partial dentures, implants, gum treatment, root canals, and extractions. Preventive care is also provided. Treatment is provided to both adults and children. °Patients are selected via a lottery and there is often a waiting list. °  °[facility] [address], °[place] ° (336) 763-8833 [email or web address] °  °[facility] [address], [place], [place] (336)723-1848, Ext. 123 Second and Fourth Thursday of each month, opens at 6:30 AM; Clinic ends at 9 AM.  Patients are seen on a first-come first-served basis, and a limited number are seen during each clinic.  ° °[facility] ° [address], [place], [place] (336) 723-7904   Eligibility Requirements °You must have lived in [place], [place], or [place] counties for at least the last three months. °  You cannot be eligible for state or federal sponsored healthcare insurance, including Veterans  Administration, Medicaid, or Medicare. °  You generally cannot be eligible for healthcare insurance through your employer.  °  How to apply: °Eligibility screenings are held every Tuesday and Wednesday afternoon from 1:00 pm until 4:00 pm. You do not need an appointment for the interview!  °[facility] [address], [place],  336-631-2330   °[facility]  336-342-8273   °[facility]  336-703-3100   °[facility]    336-570-6415   ° °Behavioral Health Resources in the Community: °Intensive Outpatient Programs °Organization         Address  Phone  Notes  °[facility] [address], [place], [place] 336-878-6098   °[facility] [address], [place], [place] 336-832-9800   °[facility] [address], [place], [place] ° 336-882-2125   °[facility] [address],  °[place], [place] 1-800-853-5163 or 336-641-4981   °Substance Abuse Resources °Organization         Address  Phone  Notes  °[facility]  336-882-2125   °[facility]  336-784-9470   °[facility]  336-285-9073   °[facility]  336-845-3988   °Residential & Outpatient Substance Abuse Program  1-800-659-3381   °Psychological Services °Organization         Address  Phone  Notes  °[facility]  336- 832-9600   °[facility]  336- 378-7881   °[facility] [address], [place] 1-800-853-5163 or 336-641-4981   ° °Mobile Crisis Teams °Organization         Address  Phone  Notes  °[facility], Mobile Crisis Care Unit  1-877-626-1772   °Assertive °[facility] ° [address] [place], [place] 336-834-9664   °Sharon DeEsch [address] °[place] 336-554-5454   ° °Self-Help/Support Groups °Organization         Address  Phone             Notes  °[facility] [place] -  variety of support groups  336- 373-1402 Call for more information  °Narcotics Anonymous (NA), [facility] [address], °[place]  2 meetings at this location  ° °Residential Treatment Programs °Organization         Address  Phone  Notes  °[facility] [address],    °[place]  1-866-801-8205   °[facility] ° [address], [place], [place] 704-293-8524   °[facility] [address], [place] 336-845-3988 Admissions: 8am-3pm M-F  °[facility] [address]    °[place], [place] 336-841-1104   °[facility] [address], [place], [place] 336-379-7146   °[facility] [address]  °[place], [place] 336-285-9073   °[facility] Intensive Outpatient [address], [place], [place] 336-852-3033   °[facility] [address]  °[place], [place] 1-877-615-2722 or 336-784-9470   °[facility] [address] [place], [place] 336-227-7417 Accepts Medicaid  °[facility] [address]  °[place] 1-800-659-3381 Substance Abuse/Addiction Treatment  ° °[place] Behavioral Health Resources °Organization         Address  Phone  Notes  °[facility]  (888) 581-9988   °Julie Brannon, PhD [address] [place], [place]   (336) 349-5553 or (336) 951-0000   °[facility]   [address] °[place], [place] (336) 349-4454   °[facility] [address], [place], [place] (336) 342-8316 Insurance/Medicaid/sponsorship through [facility]  °[facility] [address]                                    ,  (336) 342-8316 Therapy/tele-psych/case  °[facility] [address]  ° [place], [place] (336) 349-2233    °Dr. Arfeen  (336) 349-4544   °[facility] [place]  [employer] [place] [facility] 1) [address], [place] °2) [address], [place] °3)  [address], [place] (336) 349-3220 °(336) 342-7768 ° °(336) 342-8140   °[place] [facility] (336) 342-1394 or (336) 342-3537 (After Hours)    ° ° ° °

## 2013-12-09 NOTE — ED Provider Notes (Signed)
Patient seen/examined in the Emergency Department in conjunction with Midlevel Provider Forcucci Patient reports right sided chest pain Exam : chest is tender to palpation.  No RUQ tenderness Plan: stable for d/c home    Joya Gaskinsonald W Zorian Gunderman, MD 12/09/13 [phone]

## 2013-12-09 NOTE — ED Notes (Signed)
C/o right side CP x 3 days-lower back pain x 3 days-denies injury-steady gait into triage

## 2015-06-09 ENCOUNTER — Emergency Department (HOSPITAL_BASED_OUTPATIENT_CLINIC_OR_DEPARTMENT_OTHER)
Admission: EM | Admit: 2015-06-09 | Discharge: 2015-06-09 | Disposition: A | Discharge status: Home / Self Care | Payer: Self-pay | Attending: Emergency Medicine | Admitting: Emergency Medicine

## 2015-06-09 ENCOUNTER — Encounter (HOSPITAL_BASED_OUTPATIENT_CLINIC_OR_DEPARTMENT_OTHER): Payer: Self-pay

## 2015-06-09 DIAGNOSIS — J45909 Unspecified asthma, uncomplicated: Secondary | ICD-10-CM | POA: Insufficient documentation

## 2015-06-09 DIAGNOSIS — L02412 Cutaneous abscess of left axilla: Secondary | ICD-10-CM | POA: Insufficient documentation

## 2015-06-09 DIAGNOSIS — Z87891 Personal history of nicotine dependence: Secondary | ICD-10-CM | POA: Insufficient documentation

## 2015-06-09 MED ORDER — LIDOCAINE HCL 2 % IJ SOLN
20.0000 mL | Freq: Once | INTRAMUSCULAR | Status: AC
  Administered 2015-06-09: 400 mg via INTRADERMAL
  Filled 2015-06-09: qty 20

## 2015-06-09 MED ORDER — HYDROCODONE-ACETAMINOPHEN 5-325 MG PO TABS: 1.0000 | ORAL_TABLET | Freq: Four times a day (QID) | ORAL | Status: DC | PRN

## 2015-06-09 NOTE — Discharge Instructions (Signed)
Use warm compresses around the area.  Keep the area clean and dry.  Change the dressing 3 times a day

## 2015-06-09 NOTE — ED Provider Notes (Signed)
CSN: [id number]     Arrival date & time 06/09/15  1221 History   First MD Initiated Contact with Patient 06/09/15 1231     Chief Complaint  Patient presents with  . Abscess     (Consider location/radiation/quality/duration/timing/severity/associated sxs/prior Treatment) HPI Patient presents to the emergency department with an abscess to his right axilla.  The patient states that he noticed this 3 days ago was gotten worse over that timeframe.  He states that movement and palpation.  The area makes the pain worse.  Patient states he did not take any medications prior to arrival.  He states that he has not had any fever, nausea, vomiting, chest pain, shortness of breath, weakness, lethargy, or syncope.  The patient states that he has never had an abscess in the past Past Medical History  Diagnosis Date  . Asthma   . Seasonal allergies    Past Surgical History  Procedure Laterality Date  . Tonsillectomy     No family history on file. Social History  Substance Use Topics  . Smoking status: Former Games developermoker  . Smokeless tobacco: None  . Alcohol Use: Yes     Comment: occ    Review of Systems  All other systems negative except as documented in the HPI. All pertinent positives and negatives as reviewed in the HPI.  Allergies  Review of patient's allergies indicates no known allergies.  Home Medications   Prior to Admission medications   Not on File   BP 148/82 mmHg  Pulse 77  Temp(Src) 98.9 F (37.2 C) (Oral)  Resp 18  Ht 5\' 10"  (1.778 m)  Wt 122.471 kg  BMI 38.74 kg/m2  SpO2 97% Physical Exam  Constitutional: He is oriented to person, place, and time. He appears well-developed and well-nourished. No distress.  HENT:  Head: Normocephalic and atraumatic.  Eyes: Pupils are equal, round, and reactive to light.  Cardiovascular: Normal rate and regular rhythm.   Pulmonary/Chest: Effort normal.  Neurological: He is alert and oriented to person, place, and time. He exhibits  normal muscle tone. Coordination normal.  Skin: Skin is warm and dry. No rash noted. No erythema.     Nursing note and vitals reviewed.   ED Course  Procedures (including critical care time) Labs Review Labs Reviewed - No data to display  Imaging Review No results found. I have personally reviewed and evaluated these images and lab results as part of my medical decision-making.  INCISION AND DRAINAGE Performed by: Carlyle DollyLAWYER,Loranzo Desha W Consent: Verbal consent obtained. Risks and benefits: risks, benefits and alternatives were discussed Type: abscess  Body area: Left axilla  Anesthesia: local infiltration  Incision was made with a scalpel.  Local anesthetic: lidocaine 2 % without epinephrine  Anesthetic total: 5 ml  Complexity: complex Blunt dissection to break up loculations  Drainage: purulent  Drainage amount: Large   Packing material: 1/4 in iodoform gauze  Patient tolerance: Patient tolerated the procedure well with no immediate complications.   Patient is advised to return here in 2 days for recheck.  Told to use warm compresses and heat around the area.  Keep the area covered as it will continue to drain   Charlestine NightChristopher Beata Beason, PA-C 06/09/15 1325  Geoffery Lyonsouglas Delo, MD 06/09/15 1329

## 2015-06-09 NOTE — ED Notes (Signed)
Left axilla abscess since 5/11-NAD-steady gait

## 2015-10-16 ENCOUNTER — Emergency Department (HOSPITAL_BASED_OUTPATIENT_CLINIC_OR_DEPARTMENT_OTHER)
Admission: EM | Admit: 2015-10-16 | Discharge: 2015-10-16 | Disposition: A | Discharge status: Home / Self Care | Payer: No Typology Code available for payment source | Attending: Emergency Medicine | Admitting: Emergency Medicine

## 2015-10-16 ENCOUNTER — Encounter (HOSPITAL_BASED_OUTPATIENT_CLINIC_OR_DEPARTMENT_OTHER): Payer: Self-pay | Admitting: Emergency Medicine

## 2015-10-16 DIAGNOSIS — Y999 Unspecified external cause status: Secondary | ICD-10-CM | POA: Diagnosis not present

## 2015-10-16 DIAGNOSIS — M545 Low back pain: Secondary | ICD-10-CM | POA: Diagnosis not present

## 2015-10-16 DIAGNOSIS — Y92410 Unspecified street and highway as the place of occurrence of the external cause: Secondary | ICD-10-CM | POA: Insufficient documentation

## 2015-10-16 DIAGNOSIS — M542 Cervicalgia: Secondary | ICD-10-CM | POA: Diagnosis not present

## 2015-10-16 DIAGNOSIS — Z87891 Personal history of nicotine dependence: Secondary | ICD-10-CM | POA: Insufficient documentation

## 2015-10-16 DIAGNOSIS — Y9389 Activity, other specified: Secondary | ICD-10-CM | POA: Diagnosis not present

## 2015-10-16 DIAGNOSIS — J45909 Unspecified asthma, uncomplicated: Secondary | ICD-10-CM | POA: Diagnosis not present

## 2015-10-16 MED ORDER — CYCLOBENZAPRINE HCL 10 MG PO TABS: 10.0000 mg | ORAL_TABLET | Freq: Two times a day (BID) | ORAL | 0 refills | Status: DC | PRN
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
## 2016-03-21 ENCOUNTER — Emergency Department (HOSPITAL_BASED_OUTPATIENT_CLINIC_OR_DEPARTMENT_OTHER)
Admission: EM | Admit: 2016-03-21 | Discharge: 2016-03-21 | Disposition: A | Discharge status: Home / Self Care | Payer: Self-pay | Attending: Emergency Medicine | Admitting: Emergency Medicine

## 2016-03-21 ENCOUNTER — Emergency Department (HOSPITAL_BASED_OUTPATIENT_CLINIC_OR_DEPARTMENT_OTHER): Payer: Self-pay

## 2016-03-21 ENCOUNTER — Encounter (HOSPITAL_BASED_OUTPATIENT_CLINIC_OR_DEPARTMENT_OTHER): Payer: Self-pay | Admitting: Emergency Medicine

## 2016-03-21 DIAGNOSIS — Y929 Unspecified place or not applicable: Secondary | ICD-10-CM | POA: Insufficient documentation

## 2016-03-21 DIAGNOSIS — Z87891 Personal history of nicotine dependence: Secondary | ICD-10-CM | POA: Insufficient documentation

## 2016-03-21 DIAGNOSIS — Y9302 Activity, running: Secondary | ICD-10-CM | POA: Insufficient documentation

## 2016-03-21 DIAGNOSIS — S93601A Unspecified sprain of right foot, initial encounter: Secondary | ICD-10-CM | POA: Insufficient documentation

## 2016-03-21 DIAGNOSIS — J45909 Unspecified asthma, uncomplicated: Secondary | ICD-10-CM | POA: Insufficient documentation

## 2016-03-21 DIAGNOSIS — X58XXXA Exposure to other specified factors, initial encounter: Secondary | ICD-10-CM | POA: Insufficient documentation

## 2016-03-21 DIAGNOSIS — Y999 Unspecified external cause status: Secondary | ICD-10-CM | POA: Insufficient documentation

## 2016-03-21 MED ORDER — IBUPROFEN 400 MG PO TABS
600.0000 mg | ORAL_TABLET | Freq: Once | ORAL | Status: AC
  Administered 2016-03-21: 600 mg via ORAL
  Filled 2016-03-21: qty 1

## 2016-03-21 NOTE — ED Triage Notes (Signed)
Right ankle pain since last night.  Pt injured it while running.  No swelling noted.  Good pulses.

## 2016-03-21 NOTE — ED Provider Notes (Signed)
[facility]   CSN: [id number] Arrival date & time: 03/21/16  0815     History   Chief Complaint Chief Complaint  Patient presents with  . Ankle Pain    HPI Chestine SporeJermell Wixon is a 26 y.o. male.  HPI  26 year old male presents with a right foot injury that occurred last night around 9 PM. Patient states he was running and even everted his foot. He has had a hard time walking due to severe pain since. Has not taken anything for pain. No weakness/numbness. Points to his right proximal/lateral foot as the main source of pain. No knee pain or leg pain.  Past Medical History:  Diagnosis Date  . Asthma   . Seasonal allergies     There are no active problems to display for this patient.   Past Surgical History:  Procedure Laterality Date  . TONSILLECTOMY         Home Medications    Prior to Admission medications   Not on File    Family History No family history on file.  Social History Social History  Substance Use Topics  . Smoking status: Former Games developermoker  . Smokeless tobacco: Never Used  . Alcohol use Yes     Comment: occ     Allergies   Patient has no known allergies.   Review of Systems Review of Systems  Musculoskeletal: Positive for arthralgias.  Neurological: Negative for weakness and numbness.  All other systems reviewed and are negative.    Physical Exam Updated Vital Signs BP 158/77   Pulse 80   Temp 98.2 F (36.8 C) (Oral)   Ht 5\' 11"  (1.803 m)   Wt 285 lb (129.3 kg)   SpO2 99%   BMI 39.75 kg/m   Physical Exam  Constitutional: He is oriented to person, place, and time. He appears well-developed and well-nourished.  obese  HENT:  Head: Normocephalic and atraumatic.  Right Ear: External ear normal.  Left Ear: External ear normal.  Nose: Nose normal.  Eyes: Right eye exhibits no discharge. Left eye exhibits no discharge.  Neck: Neck supple.  Cardiovascular: Normal rate, regular rhythm and intact distal  pulses.   Pulses:      Dorsalis pedis pulses are 2+ on the right side.  Pulmonary/Chest: Effort normal.  Musculoskeletal: He exhibits no edema.       Right knee: No tenderness found.       Right ankle: He exhibits normal range of motion (painful but full passive ROM), no swelling, no ecchymosis and no deformity. No tenderness. Achilles tendon exhibits no pain and no defect.       Right lower leg: He exhibits no tenderness.       Right foot: There is tenderness. There is no swelling and no deformity.       Feet:  Normal sensation/strength/skin color/warmth in R foot  Neurological: He is alert and oriented to person, place, and time.  Skin: Skin is warm and dry.  Nursing note and vitals reviewed.    ED Treatments / Results  Labs (all labs ordered are listed, but only abnormal results are displayed) Labs Reviewed - No data to display  EKG  EKG Interpretation None       Radiology Dg Ankle Complete Right  Result Date: 03/21/2016 CLINICAL DATA:  Pain laterally after exercise EXAM: RIGHT ANKLE - COMPLETE 3+ VIEW COMPARISON:  None. FINDINGS: Frontal, oblique, and lateral views were obtained. No fracture or joint effusion. The ankle mortise appears intact.  No appreciable joint space narrowing. IMPRESSION: No demonstrable fracture or arthropathy. Ankle mortise appears intact. Electronically Signed   By: Bretta Bang III M.D.   On: 03/21/2016 08:47   Dg Foot Complete Right  Result Date: 03/21/2016 CLINICAL DATA:  Pain after exercise EXAM: RIGHT FOOT COMPLETE - 3+ VIEW COMPARISON:  None. FINDINGS: Frontal, oblique, and lateral views were obtained. There is no fracture or dislocation. There is no appreciable joint space narrowing or erosion. There is slight hallux valgus deformity at the first MTP joint. IMPRESSION: Slight hallux valgus deformity first MTP joint. No fracture or dislocation. No appreciable joint space narrowing. Electronically Signed   By: Bretta Bang III M.D.   On:  03/21/2016 08:48    Procedures Procedures (including critical care time)  Medications Ordered in ED Medications  ibuprofen (ADVIL,MOTRIN) tablet 600 mg (not administered)     Initial Impression / Assessment and Plan / ED Course  I have reviewed the triage vital signs and the nursing notes.  Pertinent labs & imaging results that were available during my care of the patient were reviewed by me and considered in my medical decision making (see chart for details).     X-ray shows no obvious fracture. On my viewing, there is possibly a very small cortical defect that it is nondisplaced. It is in the area of his pain so there could be a small fracture component. Most likely is a significant sprain given his eversion mechanism. Given the difficulty with walking, will place in a Cam Walker, use ibuprofen, elevation, and ice. Follow-up with orthopedics in 1 week.  Final Clinical Impressions(s) / ED Diagnoses   Final diagnoses:  Sprain of right foot, initial encounter    New Prescriptions New Prescriptions   No medications on file     Pricilla Loveless, MD 03/21/16 0900

## 2017-06-28 ENCOUNTER — Encounter (HOSPITAL_BASED_OUTPATIENT_CLINIC_OR_DEPARTMENT_OTHER): Payer: Self-pay

## 2017-06-28 ENCOUNTER — Emergency Department (HOSPITAL_BASED_OUTPATIENT_CLINIC_OR_DEPARTMENT_OTHER)
Admission: EM | Admit: 2017-06-28 | Discharge: 2017-06-28 | Disposition: A | Discharge status: Home / Self Care | Payer: No Typology Code available for payment source | Attending: Emergency Medicine | Admitting: Emergency Medicine

## 2017-06-28 ENCOUNTER — Other Ambulatory Visit: Payer: Self-pay

## 2017-06-28 ENCOUNTER — Emergency Department (HOSPITAL_BASED_OUTPATIENT_CLINIC_OR_DEPARTMENT_OTHER): Payer: No Typology Code available for payment source

## 2017-06-28 DIAGNOSIS — M545 Low back pain: Secondary | ICD-10-CM | POA: Diagnosis present

## 2017-06-28 DIAGNOSIS — M791 Myalgia, unspecified site: Secondary | ICD-10-CM | POA: Insufficient documentation

## 2017-06-28 DIAGNOSIS — F17210 Nicotine dependence, cigarettes, uncomplicated: Secondary | ICD-10-CM | POA: Insufficient documentation

## 2017-06-28 MED ORDER — NAPROXEN 500 MG PO TABS: 500.0000 mg | ORAL_TABLET | Freq: Two times a day (BID) | ORAL | 0 refills | Status: AC

## 2017-06-28 MED ORDER — METHOCARBAMOL 500 MG PO TABS: 500.0000 mg | ORAL_TABLET | Freq: Three times a day (TID) | ORAL | 0 refills | Status: AC | PRN
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
## 2019-08-05 ENCOUNTER — Encounter (HOSPITAL_BASED_OUTPATIENT_CLINIC_OR_DEPARTMENT_OTHER): Payer: Self-pay | Admitting: *Deleted

## 2019-08-05 ENCOUNTER — Other Ambulatory Visit: Payer: Self-pay

## 2019-08-05 ENCOUNTER — Emergency Department (HOSPITAL_BASED_OUTPATIENT_CLINIC_OR_DEPARTMENT_OTHER)
Admission: EM | Admit: 2019-08-05 | Discharge: 2019-08-05 | Disposition: A | Discharge status: Home / Self Care | Payer: Self-pay | Attending: Emergency Medicine | Admitting: Emergency Medicine

## 2019-08-05 DIAGNOSIS — M549 Dorsalgia, unspecified: Secondary | ICD-10-CM | POA: Insufficient documentation

## 2019-08-05 DIAGNOSIS — G43909 Migraine, unspecified, not intractable, without status migrainosus: Secondary | ICD-10-CM | POA: Insufficient documentation

## 2019-08-05 DIAGNOSIS — Z5321 Procedure and treatment not carried out due to patient leaving prior to being seen by health care provider: Secondary | ICD-10-CM | POA: Insufficient documentation

## 2019-08-05 NOTE — ED Triage Notes (Signed)
C/o migraine x 1.5 weeks and back pain 2 weeks  ibu not helping

## 2019-08-05 NOTE — ED Notes (Signed)
Called for second time  No response from lobby 

## 2019-08-05 NOTE — ED Notes (Signed)
Called x 1 to take to a treatment room  No response from lobby

## 2019-08-21 ENCOUNTER — Encounter (HOSPITAL_COMMUNITY): Payer: Self-pay

## 2019-08-21 ENCOUNTER — Ambulatory Visit (HOSPITAL_COMMUNITY)
Admission: EM | Admit: 2019-08-21 | Discharge: 2019-08-21 | Disposition: A | Discharge status: Home / Self Care | Payer: Self-pay | Attending: Family Medicine | Admitting: Family Medicine

## 2019-08-21 ENCOUNTER — Other Ambulatory Visit: Payer: Self-pay

## 2019-08-21 DIAGNOSIS — L02412 Cutaneous abscess of left axilla: Secondary | ICD-10-CM

## 2019-08-21 MED ORDER — DOXYCYCLINE HYCLATE 100 MG PO CAPS: 100.0000 mg | ORAL_CAPSULE | Freq: Two times a day (BID) | ORAL | 0 refills | Status: AC

## 2019-08-21 NOTE — ED Provider Notes (Signed)
[facility]   [id number] 08/21/19 Arrival Time: 1314  ASSESSMENT & PLAN:  1. Abscess of left axilla     Incision and Drainage Procedure Note  Anesthesia: 1% lidocaine with epinephrine  Procedure Details  The procedure, risks and complications have been discussed in detail (including, but not limited to pain and bleeding) with the patient.  The skin induration was prepped and draped in the usual fashion. After adequate local anesthesia, I&D with a #11 blade was performed on the left axilla with copious, purulent drainage.  EBL: minimal Drains: none Packing: none Condition: Tolerated procedure well Complications: none.  Meds ordered this encounter  Medications  . doxycycline (VIBRAMYCIN) 100 MG capsule    Sig: Take 1 capsule (100 mg total) by mouth 2 (two) times daily.    Dispense:  14 capsule    Refill:  0    Wound care instructions discussed and given in written format. To return in 48 hours for wound check if needed.  Finish all antibiotics. OTC analgesics as needed.  Reviewed expectations re: course of current medical issues. Questions answered. Outlined signs and symptoms indicating need for more acute intervention. Patient verbalized understanding. After Visit Summary given.   SUBJECTIVE:  Stephen Perry is a 29 y.o. male who presents with a possible infection of his L axilla. Onset gradual, approximately 2-3 d ago without active drainage and without active bleeding. Symptoms have gradually worsened since beginning; increasing pain. Fever: absent. OTC/home treatment: none. H/O similar several y ago requiring I&D.   OBJECTIVE:  Vitals:   08/21/19 1429  BP: (!) 145/81  Pulse: 98  Resp: 18  Temp: 98.4 F (36.9 C)  SpO2: 98%     General appearance: alert; no distress Skin: approx 1 x 2 cm very fluctuant induration of his L axilla; tender to touch; no active drainage or bleeding Psychological: alert and cooperative; normal mood and  affect  No Known Allergies  Past Medical History:  Diagnosis Date  . Asthma   . Seasonal allergies    Social History   Socioeconomic History  . Marital status: Single    Spouse name: Not on file  . Number of children: Not on file  . Years of education: Not on file  . Highest education level: Not on file  Occupational History  . Not on file  Tobacco Use  . Smoking status: Current Every Day Smoker  . Smokeless tobacco: Never Used  Substance and Sexual Activity  . Alcohol use: Yes    Comment: occ  . Drug use: No  . Sexual activity: Not on file  Other Topics Concern  . Not on file  Social History Narrative  . Not on file   Social Determinants of Health   Financial Resource Strain:   . Difficulty of Paying Living Expenses:   Food Insecurity:   . Worried About Programme researcher, broadcasting/film/video in the Last Year:   . Barista in the Last Year:   Transportation Needs:   . Freight forwarder (Medical):   Marland Kitchen Lack of Transportation (Non-Medical):   Physical Activity:   . Days of Exercise per Week:   . Minutes of Exercise per Session:   Stress:   . Feeling of Stress :   Social Connections:   . Frequency of Communication with Friends and Family:   . Frequency of Social Gatherings with Friends and Family:   . Attends Religious Services:   . Active Member of Clubs or Organizations:   . Attends  Club or Organization Meetings:   Marland Kitchen Marital Status:    Family History  Problem Relation Age of Onset  . Diabetes Mother   . Healthy Father    Past Surgical History:  Procedure Laterality Date  . Greig Right, MD 08/21/19 [id number]

## 2019-08-21 NOTE — ED Triage Notes (Signed)
Pt presents with complaints of abscess under his left arm x 2 days. Reports trying warm compress at home with no relief. States area is painful.

## 2019-08-21 NOTE — Discharge Instructions (Addendum)
If not allergic, you may use over the counter ibuprofen or acetaminophen as needed. Begin and finish the antibiotic prescribed.

## 2021-02-18 ENCOUNTER — Emergency Department (HOSPITAL_BASED_OUTPATIENT_CLINIC_OR_DEPARTMENT_OTHER): Payer: Self-pay

## 2021-02-18 ENCOUNTER — Encounter (HOSPITAL_BASED_OUTPATIENT_CLINIC_OR_DEPARTMENT_OTHER): Payer: Self-pay | Admitting: Emergency Medicine

## 2021-02-18 ENCOUNTER — Emergency Department (HOSPITAL_BASED_OUTPATIENT_CLINIC_OR_DEPARTMENT_OTHER)
Admission: EM | Admit: 2021-02-18 | Discharge: 2021-02-18 | Disposition: A | Discharge status: Home / Self Care | Payer: Self-pay | Attending: Emergency Medicine | Admitting: Emergency Medicine

## 2021-02-18 ENCOUNTER — Other Ambulatory Visit: Payer: Self-pay

## 2021-02-18 DIAGNOSIS — M940 Chondrocostal junction syndrome [Tietze]: Secondary | ICD-10-CM | POA: Insufficient documentation

## 2021-02-18 DIAGNOSIS — R0789 Other chest pain: Secondary | ICD-10-CM | POA: Insufficient documentation

## 2021-02-18 DIAGNOSIS — R109 Unspecified abdominal pain: Secondary | ICD-10-CM | POA: Insufficient documentation

## 2021-02-18 LAB — CBC
HCT: 40.2 % (ref 39.0–52.0)
Hemoglobin: 13.4 g/dL (ref 13.0–17.0)
MCH: 30.2 pg (ref 26.0–34.0)
MCHC: 33.3 g/dL (ref 30.0–36.0)
MCV: 90.7 fL (ref 80.0–100.0)
Platelets: 279 10*3/uL (ref 150–400)
RBC: 4.43 MIL/uL (ref 4.22–5.81)
RDW: 14.3 % (ref 11.5–15.5)
WBC: 7.7 10*3/uL (ref 4.0–10.5)
nRBC: 0 % (ref 0.0–0.2)

## 2021-02-18 LAB — BASIC METABOLIC PANEL
Anion gap: 5 (ref 5–15)
BUN: 10 mg/dL (ref 6–20)
CO2: 27 mmol/L (ref 22–32)
Calcium: 9.1 mg/dL (ref 8.9–10.3)
Chloride: 107 mmol/L (ref 98–111)
Creatinine, Ser: 0.95 mg/dL (ref 0.61–1.24)
GFR, Estimated: >60 mL/min (ref >60)
Glucose, Bld: 94 mg/dL (ref 70–99)
Potassium: 4.4 mmol/L (ref 3.5–5.1)
Sodium: 139 mmol/L (ref 135–145)

## 2021-02-18 LAB — TROPONIN I (HIGH SENSITIVITY): Troponin I (High Sensitivity): 3 ng/L (ref <18)

## 2021-02-18 MED ORDER — HYDROCODONE-ACETAMINOPHEN 5-325 MG PO TABS: 1.0000 | ORAL_TABLET | Freq: Four times a day (QID) | ORAL | 0 refills | Status: AC | PRN

## 2021-02-18 MED ORDER — LIDOCAINE 5 % EX PTCH
1.0000 | MEDICATED_PATCH | CUTANEOUS | Status: DC
  Administered 2021-02-18: 1 via TRANSDERMAL
  Filled 2021-02-18: qty 1

## 2021-02-18 MED ORDER — ACETAMINOPHEN 325 MG PO TABS
650.0000 mg | ORAL_TABLET | Freq: Once | ORAL | Status: AC
  Administered 2021-02-18: 650 mg via ORAL
  Filled 2021-02-18: qty 2

## 2021-02-18 MED ORDER — IBUPROFEN 600 MG PO TABS: 600.0000 mg | ORAL_TABLET | Freq: Four times a day (QID) | ORAL | 0 refills | Status: AC | PRN

## 2021-02-18 MED ORDER — IBUPROFEN 800 MG PO TABS
800.0000 mg | ORAL_TABLET | Freq: Once | ORAL | Status: AC
  Administered 2021-02-18: 800 mg via ORAL
  Filled 2021-02-18: qty 1

## 2021-02-18 NOTE — ED Triage Notes (Signed)
Epigastric/lower chest pain since 1630 1/25. Hurts worse when moving. Tried muscle relaxer's and Asprin at home with no relief.

## 2021-02-18 NOTE — ED Provider Notes (Signed)
[facility] DEPART[facility] Provider Note   CSN: [id number] Arrival date & time: 02/18/21  [id number]     History  Chief Complaint  Patient presents with   Chest Pain   Abdominal Pain    Stephen Perry is a 31 y.o. male.  Pt is a 31 yo male with no significant pmh presenting for complaints of chest pain. Pt admits to sternal chest pain that radiates to the back, constant, sharp, severe-8/10, that started yesterday while driving in the car. No recent heavy lifting. Gym-goer but not in the past 4 days. Denies fevers, chills, coughing.   The history is provided by the patient. No language interpreter was used.  Chest Pain Associated symptoms: abdominal pain   Associated symptoms: no back pain, no cough, no fever, no palpitations, no shortness of breath and no vomiting   Abdominal Pain Associated symptoms: chest pain   Associated symptoms: no chills, no cough, no dysuria, no fever, no hematuria, no shortness of breath, no sore throat and no vomiting       Home Medications Prior to Admission medications   Medication Sig Start Date End Date Taking? Authorizing Provider  doxycycline (VIBRAMYCIN) 100 MG capsule Take 1 capsule (100 mg total) by mouth 2 (two) times daily. 08/21/19   Vanessa Kick, MD  methocarbamol (ROBAXIN) 500 MG tablet Take 1 tablet (500 mg total) by mouth every 8 (eight) hours as needed. 06/28/17   Petrucelli, Samantha R, PA-C  naproxen (NAPROSYN) 500 MG tablet Take 1 tablet (500 mg total) by mouth 2 (two) times daily. 06/28/17   Petrucelli, Glynda Jaeger, PA-C      Allergies    Patient has no known allergies.    Review of Systems   Review of Systems  Constitutional:  Negative for chills and fever.  HENT:  Negative for ear pain and sore throat.   Eyes:  Negative for pain and visual disturbance.  Respiratory:  Negative for cough and shortness of breath.   Cardiovascular:  Positive for chest pain. Negative for palpitations.  Gastrointestinal:  Positive for  abdominal pain. Negative for vomiting.  Genitourinary:  Negative for dysuria and hematuria.  Musculoskeletal:  Negative for arthralgias and back pain.  Skin:  Negative for color change and rash.  Neurological:  Negative for seizures and syncope.  All other systems reviewed and are negative.  Physical Exam Updated Vital Signs BP (!) 144/71 (BP Location: Right Arm)    Pulse 74    Temp 98.1 F (36.7 C) (Oral)    Resp 20    Ht 5\' 11"  (1.803 m)    Wt 104.3 kg    SpO2 100%    BMI 32.08 kg/m  Physical Exam Vitals and nursing note reviewed.  Constitutional:      General: He is not in acute distress.    Appearance: He is well-developed.  HENT:     Head: Normocephalic and atraumatic.  Eyes:     Conjunctiva/sclera: Conjunctivae normal.  Cardiovascular:     Rate and Rhythm: Normal rate and regular rhythm.     Heart sounds: No murmur heard. Pulmonary:     Effort: Pulmonary effort is normal. No respiratory distress.     Breath sounds: Normal breath sounds.  Abdominal:     Palpations: Abdomen is soft.     Tenderness: There is no abdominal tenderness.  Musculoskeletal:        General: No swelling.     Cervical back: Neck supple.  Skin:    General: Skin is  warm and dry.     Capillary Refill: Capillary refill takes less than 2 seconds.  Neurological:     Mental Status: He is alert.  Psychiatric:        Mood and Affect: Mood normal.    ED Results / Procedures / Treatments   Labs (all labs ordered are listed, but only abnormal results are displayed) Labs Reviewed - No data to display  EKG None  Radiology No results found.  Procedures Procedures    Medications Ordered in ED Medications - No data to display  ED Course/ Medical Decision Making/ A&P                           Medical Decision Making Amount and/or Complexity of Data Reviewed Labs: ordered. Radiology: ordered. ECG/medicine tests: ordered.  Risk OTC drugs. Prescription drug management.   7:41 AM 31 yo  male with no significant pmh presenting for complaints of chest pain.   The patient's chest pain is not suggestive of pulmonary embolus, cardiac ischemia, aortic dissection, pericarditis, myocarditis, pulmonary embolism, pneumothorax, pneumonia, Zoster, or esophageal perforation, or other serious etiology.  Historically not abrupt in onset, tearing or ripping, pulses symmetric. EKG nonspecific for ischemia/infarction. No dysrhythmias, brugada, WPW, prolonged QT noted. CXR reviewed and WNL. Troponin negative. CXR reviewed. Labs without demonstration of acute pathology unless otherwise noted above. Low HEART Score: 0-3 points (0.9-1.7% risk of MACE).] Given the extremely low risk of these diagnoses further testing and evaluation for these possibilities does not appear to be indicated at this time. Patient in no distress and overall condition improved here in the ED. Detailed discussions were had with the patient regarding current findings, and need for close f/u with PCP or on call doctor. The patient has been instructed to return immediately if the symptoms worsen in any way for re-evaluation. Patient verbalized understanding and is in agreement with current care plan. All questions answered prior to discharge.          Final Clinical Impression(s) / ED Diagnoses Final diagnoses:  Chest wall pain  Costochondritis, acute    Rx / DC Orders ED Discharge Orders     None         Lianne Cure, DO [id number]

## 2021-09-22 ENCOUNTER — Encounter (HOSPITAL_BASED_OUTPATIENT_CLINIC_OR_DEPARTMENT_OTHER): Payer: Self-pay

## 2021-09-22 ENCOUNTER — Emergency Department (HOSPITAL_BASED_OUTPATIENT_CLINIC_OR_DEPARTMENT_OTHER)
Admission: EM | Admit: 2021-09-22 | Discharge: 2021-09-22 | Disposition: A | Discharge status: Home / Self Care | Payer: Self-pay | Attending: Emergency Medicine | Admitting: Emergency Medicine

## 2021-09-22 DIAGNOSIS — Z20822 Contact with and (suspected) exposure to covid-19: Secondary | ICD-10-CM | POA: Insufficient documentation

## 2021-09-22 DIAGNOSIS — K529 Noninfective gastroenteritis and colitis, unspecified: Secondary | ICD-10-CM | POA: Insufficient documentation

## 2021-09-22 LAB — COMPREHENSIVE METABOLIC PANEL
ALT: 20 U/L (ref 0–44)
AST: 21 U/L (ref 15–41)
Albumin: 4.3 g/dL (ref 3.5–5.0)
Alkaline Phosphatase: 49 U/L (ref 38–126)
Anion gap: 7 (ref 5–15)
BUN: 9 mg/dL (ref 6–20)
CO2: 25 mmol/L (ref 22–32)
Calcium: 8.8 mg/dL — ABNORMAL LOW (ref 8.9–10.3)
Chloride: 105 mmol/L (ref 98–111)
Creatinine, Ser: 0.85 mg/dL (ref 0.61–1.24)
GFR, Estimated: >60 mL/min (ref >60)
Glucose, Bld: 91 mg/dL (ref 70–99)
Potassium: 3.7 mmol/L (ref 3.5–5.1)
Sodium: 137 mmol/L (ref 135–145)
Total Bilirubin: 0.6 mg/dL (ref 0.3–1.2)
Total Protein: 7.6 g/dL (ref 6.5–8.1)

## 2021-09-22 LAB — URINALYSIS, ROUTINE W REFLEX MICROSCOPIC
Glucose, UA: NEGATIVE mg/dL
Hgb urine dipstick: NEGATIVE
Ketones, ur: NEGATIVE mg/dL
Leukocytes,Ua: NEGATIVE
Nitrite: NEGATIVE
Protein, ur: 30 mg/dL — AB
Specific Gravity, Urine: >=1.03 (ref 1.005–1.030)
pH: 6 (ref 5.0–8.0)

## 2021-09-22 LAB — CBC
HCT: 41 % (ref 39.0–52.0)
Hemoglobin: 13.9 g/dL (ref 13.0–17.0)
MCH: 30.5 pg (ref 26.0–34.0)
MCHC: 33.9 g/dL (ref 30.0–36.0)
MCV: 90.1 fL (ref 80.0–100.0)
Platelets: 260 10*3/uL (ref 150–400)
RBC: 4.55 MIL/uL (ref 4.22–5.81)
RDW: 13.6 % (ref 11.5–15.5)
WBC: 7.5 10*3/uL (ref 4.0–10.5)
nRBC: 0 % (ref 0.0–0.2)

## 2021-09-22 LAB — URINALYSIS, MICROSCOPIC (REFLEX)

## 2021-09-22 LAB — RESP PANEL BY RT-PCR (FLU A&B, COVID) ARPGX2
Influenza A by PCR: NEGATIVE
Influenza B by PCR: NEGATIVE
SARS Coronavirus 2 by RT PCR: NEGATIVE

## 2021-09-22 LAB — LIPASE, BLOOD: Lipase: 39 U/L (ref 11–51)

## 2021-09-22 MED ORDER — ONDANSETRON HCL 4 MG PO TABS: 4.0000 mg | ORAL_TABLET | Freq: Four times a day (QID) | ORAL | 0 refills | Status: AC

## 2021-09-22 MED ORDER — ONDANSETRON HCL 4 MG/2ML IJ SOLN
4.0000 mg | Freq: Once | INTRAMUSCULAR | Status: AC
  Administered 2021-09-22: 4 mg via INTRAVENOUS
  Filled 2021-09-22: qty 2

## 2021-09-22 NOTE — ED Triage Notes (Signed)
C/o abdominal pain & diarrhea since Monday. 1 episode of emesis.

## 2021-09-22 NOTE — ED Provider Notes (Signed)
[facility] EMERGENCY DEPARTMENT Provider Note   CSN: [id number] Arrival date & time: 09/22/21  5329     History  Chief Complaint  Patient presents with   Abdominal Pain    Stephen Perry is a 31 y.o. male.  Patient is a 31 year old male presenting for complaints of generalized abdominal pain and diarrhea.  Patient states Monday he had severe generalized abdominal discomfort so stated with 1 episode of nausea and vomiting and multiple episodes of diarrhea.  States the symptoms have been worsening over the past 2 days.  Denies any fevers, chills, or malaise.  Able to tolerate water p.o.  and states he feels well hydrated.  Denies any suspicious food intake, and foreign travel, or sick contacts.  States he wants to be evaluated to make sure he does not have COVID-19 virus.  The history is provided by the patient. No language interpreter was used.  Abdominal Pain Associated symptoms: diarrhea, nausea and vomiting   Associated symptoms: no chest pain, no chills, no cough, no dysuria, no fever, no hematuria, no shortness of breath and no sore throat        Home Medications Prior to Admission medications   Medication Sig Start Date End Date Taking? Authorizing Provider  ondansetron (ZOFRAN) 4 MG tablet Take 1 tablet (4 mg total) by mouth every 6 (six) hours. 09/22/21  Yes Edwin Dada P, DO  doxycycline (VIBRAMYCIN) 100 MG capsule Take 1 capsule (100 mg total) by mouth 2 (two) times daily. 08/21/19   Mardella Layman, MD  HYDROcodone-acetaminophen (NORCO) 5-325 MG tablet Take 1 tablet by mouth every 6 (six) hours as needed for severe pain. 02/18/21   Edwin Dada P, DO  ibuprofen (ADVIL) 600 MG tablet Take 1 tablet (600 mg total) by mouth every 6 (six) hours as needed for mild pain. 02/18/21   Edwin Dada P, DO  methocarbamol (ROBAXIN) 500 MG tablet Take 1 tablet (500 mg total) by mouth every 8 (eight) hours as needed. 06/28/17   Petrucelli, Samantha R, PA-C  naproxen (NAPROSYN) 500  MG tablet Take 1 tablet (500 mg total) by mouth 2 (two) times daily. 06/28/17   Petrucelli, Pleas Koch, PA-C      Allergies    Patient has no known allergies.    Review of Systems   Review of Systems  Constitutional:  Negative for chills and fever.  HENT:  Negative for ear pain and sore throat.   Eyes:  Negative for pain and visual disturbance.  Respiratory:  Negative for cough and shortness of breath.   Cardiovascular:  Negative for chest pain and palpitations.  Gastrointestinal:  Positive for abdominal pain, diarrhea, nausea and vomiting.  Genitourinary:  Negative for dysuria and hematuria.  Musculoskeletal:  Negative for arthralgias and back pain.  Skin:  Negative for color change and rash.  Neurological:  Negative for seizures and syncope.  All other systems reviewed and are negative.   Physical Exam Updated Vital Signs BP 123/63   Pulse (!) 57   Temp 98.2 F (36.8 C) (Oral)   Resp 16   Ht 5\' 10"  (1.778 m)   Wt 103.4 kg   SpO2 98%   BMI 32.71 kg/m  Physical Exam Vitals and nursing note reviewed.  Constitutional:      General: He is not in acute distress.    Appearance: He is well-developed.  HENT:     Head: Normocephalic and atraumatic.  Eyes:     Conjunctiva/sclera: Conjunctivae normal.  Cardiovascular:  Rate and Rhythm: Normal rate and regular rhythm.     Heart sounds: No murmur heard. Pulmonary:     Effort: Pulmonary effort is normal. No respiratory distress.     Breath sounds: Normal breath sounds.  Abdominal:     Palpations: Abdomen is soft.     Tenderness: There is no abdominal tenderness.  Musculoskeletal:        General: No swelling.     Cervical back: Neck supple.  Skin:    General: Skin is warm and dry.     Capillary Refill: Capillary refill takes less than 2 seconds.  Neurological:     Mental Status: He is alert.  Psychiatric:        Mood and Affect: Mood normal.     ED Results / Procedures / Treatments   Labs (all labs ordered are  listed, but only abnormal results are displayed) Labs Reviewed  COMPREHENSIVE METABOLIC PANEL - Abnormal; Notable for the following components:      Result Value   Calcium 8.8 (*)    All other components within normal limits  URINALYSIS, ROUTINE W REFLEX MICROSCOPIC - Abnormal; Notable for the following components:   Bilirubin Urine SMALL (*)    Protein, ur 30 (*)    All other components within normal limits  URINALYSIS, MICROSCOPIC (REFLEX) - Abnormal; Notable for the following components:   Bacteria, UA RARE (*)    All other components within normal limits  RESP PANEL BY RT-PCR (FLU A&B, COVID) ARPGX2  LIPASE, BLOOD  CBC    EKG None  Radiology No results found.  Procedures Procedures    Medications Ordered in ED Medications  ondansetron (ZOFRAN) injection 4 mg (4 mg Intravenous Given 09/22/21 0844)    ED Course/ Medical Decision Making/ A&P                           Medical Decision Making Amount and/or Complexity of Data Reviewed Labs: ordered.  Risk Prescription drug management.   21:14 PM 31 year old male presenting for complaints of generalized abdominal pain and diarrhea.  Patient is alert oriented answer, no acute distress, afebrile, stable vital signs.  Physical exam demonstrates soft abdomen with no tenderness.  Patient states he is been staying hydrated lately and is able to tolerate p.o. without difficulty.  We will hold off on IV fluids at this time.  Laboratory studies pending to check electrolytes.  Swab ordered as requested by the patient.  COVID swab negative Stable electrolytes Stable liver profile, lipase, and renal function. Abdomen is soft and nontender on exam.  Low suspicion for any acute life-threatening etiologies of abdominal pain including but not limited to perforation, obstruction, appendicitis, sepsis, etc.  Symptoms likely secondary to gastroenteritis.  Zofran sent to pharmacy for symptomatic relief of nausea.  Recommended to increase  oral hydration and drink Pedialyte or Gatorade for electrolyte replacement if diarrhea continues.  Patient in no distress and overall condition improved here in the ED. Detailed discussions were had with the patient regarding current findings, and need for close f/u with PCP or on call doctor. The patient has been instructed to return immediately if the symptoms worsen in any way for re-evaluation. Patient verbalized understanding and is in agreement with current care plan. All questions answered prior to discharge.         Final Clinical Impression(s) / ED Diagnoses Final diagnoses:  Gastroenteritis    Rx / DC Orders ED Discharge Orders  Ordered    ondansetron (ZOFRAN) 4 MG tablet  Every 6 hours        09/22/21 1004              Franne Forts, [place] 09/24/21 2354

## 2021-09-22 NOTE — Discharge Instructions (Signed)
Stay hydrated.  Drink Gatorade or Pedialyte if diarrhea continues.  Use Zofran every 6 hours as needed for nausea and diarrhea.

## 2022-05-23 ENCOUNTER — Emergency Department (HOSPITAL_BASED_OUTPATIENT_CLINIC_OR_DEPARTMENT_OTHER): Payer: Self-pay

## 2022-05-23 ENCOUNTER — Emergency Department (HOSPITAL_BASED_OUTPATIENT_CLINIC_OR_DEPARTMENT_OTHER)
Admission: EM | Admit: 2022-05-23 | Discharge: 2022-05-23 | Disposition: A | Discharge status: Home / Self Care | Payer: Self-pay | Attending: Emergency Medicine | Admitting: Emergency Medicine

## 2022-05-23 ENCOUNTER — Encounter (HOSPITAL_BASED_OUTPATIENT_CLINIC_OR_DEPARTMENT_OTHER): Payer: Self-pay

## 2022-05-23 DIAGNOSIS — X500XXA Overexertion from strenuous movement or load, initial encounter: Secondary | ICD-10-CM | POA: Insufficient documentation

## 2022-05-23 DIAGNOSIS — T148XXA Other injury of unspecified body region, initial encounter: Secondary | ICD-10-CM

## 2022-05-23 DIAGNOSIS — Y93B3 Activity, free weights: Secondary | ICD-10-CM | POA: Insufficient documentation

## 2022-05-23 DIAGNOSIS — S39012A Strain of muscle, fascia and tendon of lower back, initial encounter: Secondary | ICD-10-CM | POA: Insufficient documentation

## 2022-05-23 DIAGNOSIS — M545 Low back pain, unspecified: Secondary | ICD-10-CM

## 2022-05-23 MED ORDER — KETOROLAC TROMETHAMINE 15 MG/ML IJ SOLN
15.0000 mg | Freq: Once | INTRAMUSCULAR | Status: AC
  Administered 2022-05-23: 15 mg via INTRAMUSCULAR
  Filled 2022-05-23: qty 1

## 2022-05-23 MED ORDER — HYDROCODONE-ACETAMINOPHEN 5-325 MG PO TABS
1.0000 | ORAL_TABLET | ORAL | Status: AC
  Administered 2022-05-23: 1 via ORAL
  Filled 2022-05-23: qty 1

## 2022-05-23 MED ORDER — LIDOCAINE 5 % EX PTCH
2.0000 | MEDICATED_PATCH | CUTANEOUS | Status: DC
  Administered 2022-05-23: 2 via TRANSDERMAL
  Filled 2022-05-23: qty 2

## 2022-05-23 MED ORDER — CYCLOBENZAPRINE HCL 10 MG PO TABS: 10.0000 mg | ORAL_TABLET | Freq: Two times a day (BID) | ORAL | 0 refills | Status: AC | PRN

## 2022-05-23 NOTE — ED Provider Notes (Signed)
[place] [facility] Provider Note   CSN: [id number] Arrival date & time: 05/23/22  0732     History  Chief Complaint  Patient presents with   Back Pain    Stephen Perry is a 32 y.o. male.  32 year old male with a history of chronic back pain who presents emergency department with back pain.  On Saturday patient was bench pressing and felt a pop in his lower back and has been having back pain since then.  Says that it does not radiate and is worsened with movement.  Denies any bowel or bladder incontinence, changes in sensation when wiping, or lower extremity weakness or numbness.  No fevers, history of cancer, history of IV drug use or indwelling catheters, or history of surgery on his back.  Says that he did not feel like he could go to work today so came to the emergency department to be evaluated.  Does have a history of back injury from football that occasionally flares up.       Home Medications Prior to Admission medications   Medication Sig Start Date End Date Taking? Authorizing Provider  cyclobenzaprine (FLEXERIL) 10 MG tablet Take 1 tablet (10 mg total) by mouth 2 (two) times daily as needed for muscle spasms. 05/23/22  Yes Rondel Baton, MD  doxycycline (VIBRAMYCIN) 100 MG capsule Take 1 capsule (100 mg total) by mouth 2 (two) times daily. 08/21/19   Mardella Layman, MD  HYDROcodone-acetaminophen (NORCO) 5-325 MG tablet Take 1 tablet by mouth every 6 (six) hours as needed for severe pain. 02/18/21   Edwin Dada P, DO  ibuprofen (ADVIL) 600 MG tablet Take 1 tablet (600 mg total) by mouth every 6 (six) hours as needed for mild pain. 02/18/21   Edwin Dada P, DO  methocarbamol (ROBAXIN) 500 MG tablet Take 1 tablet (500 mg total) by mouth every 8 (eight) hours as needed. 06/28/17   Petrucelli, Samantha R, PA-C  naproxen (NAPROSYN) 500 MG tablet Take 1 tablet (500 mg total) by mouth 2 (two) times daily. 06/28/17   Petrucelli, Samantha R,  PA-C  ondansetron (ZOFRAN) 4 MG tablet Take 1 tablet (4 mg total) by mouth every 6 (six) hours. 09/22/21   Franne Forts, DO      Allergies    Patient has no known allergies.    Review of Systems   Review of Systems  Physical Exam Updated Vital Signs BP (!) 154/92 (BP Location: Right Arm)   Pulse 79   Temp 98.3 F (36.8 C) (Oral)   Resp 16   Ht 5\' 10"  (1.778 m)   Wt 105.1 kg   SpO2 100%   BMI 33.26 kg/m  Physical Exam Vitals and nursing note reviewed.  Constitutional:      General: He is not in acute distress.    Appearance: He is well-developed.  HENT:     Head: Normocephalic and atraumatic.     Right Ear: External ear normal.     Left Ear: External ear normal.     Nose: Nose normal.  Eyes:     Extraocular Movements: Extraocular movements intact.     Conjunctiva/sclera: Conjunctivae normal.     Pupils: Pupils are equal, round, and reactive to light.  Pulmonary:     Effort: Pulmonary effort is normal. No respiratory distress.  Musculoskeletal:     Cervical back: Normal range of motion and neck supple.     Right lower leg: No edema.     Left  lower leg: No edema.     Comments: No spinal midline TTP in cervical, thoracic spine.  Tenderness to palpation of approximately L1 region with mild paraspinal tenderness to palpation.  No stepoffs noted.   Motor: Muscle bulk and tone are normal. Strength is 5/5 in hip flexion, knee flexion and extension, ankle dorsiflexion and plantar flexion bilaterally. Full strength of great toe dorsiflexion bilaterally.  Sensory: Intact sensation to light touch in L2 though S1 dermatomes bilaterally.   Negative straight leg raise test bilaterally  Skin:    General: Skin is warm and dry.  Neurological:     Mental Status: He is alert. Mental status is at baseline.  Psychiatric:        Mood and Affect: Mood normal.        Behavior: Behavior normal.     ED Results / Procedures / Treatments   Labs (all labs ordered are listed, but only  abnormal results are displayed) Labs Reviewed - No data to display  EKG None  Radiology DG Lumbar Spine 2-3 Views  Result Date: 05/23/2022 CLINICAL DATA:  Upper lumbar region pain after weight lifting. Symptoms began 2 days ago. EXAM: LUMBAR SPINE - 2-3 VIEW COMPARISON:  06/28/2017 FINDINGS: No acute radiographic finding. Normal alignment. Five lumbar type vertebral bodies. Chronic lower thoracic and L1-2 endplate Schmorl's nodes. No evidence of an acute fracture or acute visible Schmorl's node. I think there is mild chronic disc space narrowing at L4-5. IMPRESSION: No acute or traumatic finding. Mild chronic disc space narrowing at L4-5. Chronic lower thoracic and upper lumbar endplate Schmorl's nodes. Electronically Signed   By: Paulina Fusi M.D.   On: 05/23/2022 08:03    Procedures Procedures   Medications Ordered in ED Medications  lidocaine (LIDODERM) 5 % 2 patch (2 patches Transdermal Patch Applied 05/23/22 0757)  HYDROcodone-acetaminophen (NORCO/VICODIN) 5-325 MG per tablet 1 tablet (1 tablet Oral Given 05/23/22 0758)  ketorolac (TORADOL) 15 MG/ML injection 15 mg (15 mg Intramuscular Given 05/23/22 0758)    ED Course/ Medical Decision Making/ A&P                             Medical Decision Making Amount and/or Complexity of Data Reviewed Radiology: ordered.  Risk Prescription drug management.   Stephen Perry is a 32 y.o. male with comorbidities that complicate the patient evaluation including back pain who presents emergency department with back pain without any neurologic symptoms  Initial Ddx:  Pathologic fracture, muscle strain, spinal cord compression  MDM:  The patient likely had a muscle strain given his symptoms.  However with the mechanism of bench pressing and the compressive forces will obtain x-ray to evaluate for pathologic fracture but feel this is less likely.  No signs of spinal cord compression at this time.  Plan:  Pain medication Spine x-ray  ED  Summary/Re-evaluation:  Patient feeling better after the above interventions.  X-ray did show chronic disc space narrowing at L4-L5.  No fracture or other concerning abnormalities were found.  Will have him follow-up with sports medicine and spine team regarding his symptoms.  This patient presents to the ED for concern of complaints listed in HPI, this involves an extensive number of treatment options, and is a complaint that carries with it a high risk of complications and morbidity. Disposition including potential need for admission considered.   Dispo: DC Home. Return precautions discussed including, but not limited to, those listed in the AVS. Allowed  pt time to ask questions which were answered fully prior to dc.  Records reviewed Outpatient Clinic Notes I independently reviewed the following imaging with scope of interpretation limited to determining acute life threatening conditions related to emergency care:  lumbar spine x-ray  and agree with the radiologist interpretation with the following exceptions: none I personally reviewed and interpreted cardiac monitoring: normal sinus rhythm  I have reviewed the patients home medications and made adjustments as needed  Final Clinical Impression(s) / ED Diagnoses Final diagnoses:  Acute midline low back pain without sciatica  Muscle strain    Rx / DC Orders ED Discharge Orders          Ordered    cyclobenzaprine (FLEXERIL) 10 MG tablet  2 times daily PRN        05/23/22 0752              Rondel Baton, MD 05/23/22 [phone]

## 2022-05-23 NOTE — Discharge Instructions (Addendum)
You were seen for your back pain in the emergency department.   At home, please use Tylenol, ibuprofen, lidocaine patches, and the cyclobenzaprine we have prescribed you as needed for pain.  Do not take the cyclobenzaprine before driving or operating heavy machinery.  Follow-up with a sports medicine doctor in 2-3 days regarding your visit.  Follow-up with the spine clinic as soon as possible regarding your symptoms.  Return immediately to the emergency department if you experience any of the following: Numbness or weakness of your legs, bowel or bladder incontinence, numbness while wiping after pooping or urinating, or any other concerning symptoms.    Thank you for visiting our Emergency Department. It was a pleasure taking care of you today.

## 2022-05-23 NOTE — ED Triage Notes (Signed)
States Saturday after leaving the gym he started having lower back pain. States he thinks he strained something.

## 2022-07-03 IMAGING — DX DG CHEST 1V PORT
1 series · 1 of 1 positions shown · non-contrast
Comparison: 12/09/2013

CLINICAL DATA: Chest and epigastric pain beginning yesterday.

EXAM:
PORTABLE CHEST 1 VIEW

[chest ap]
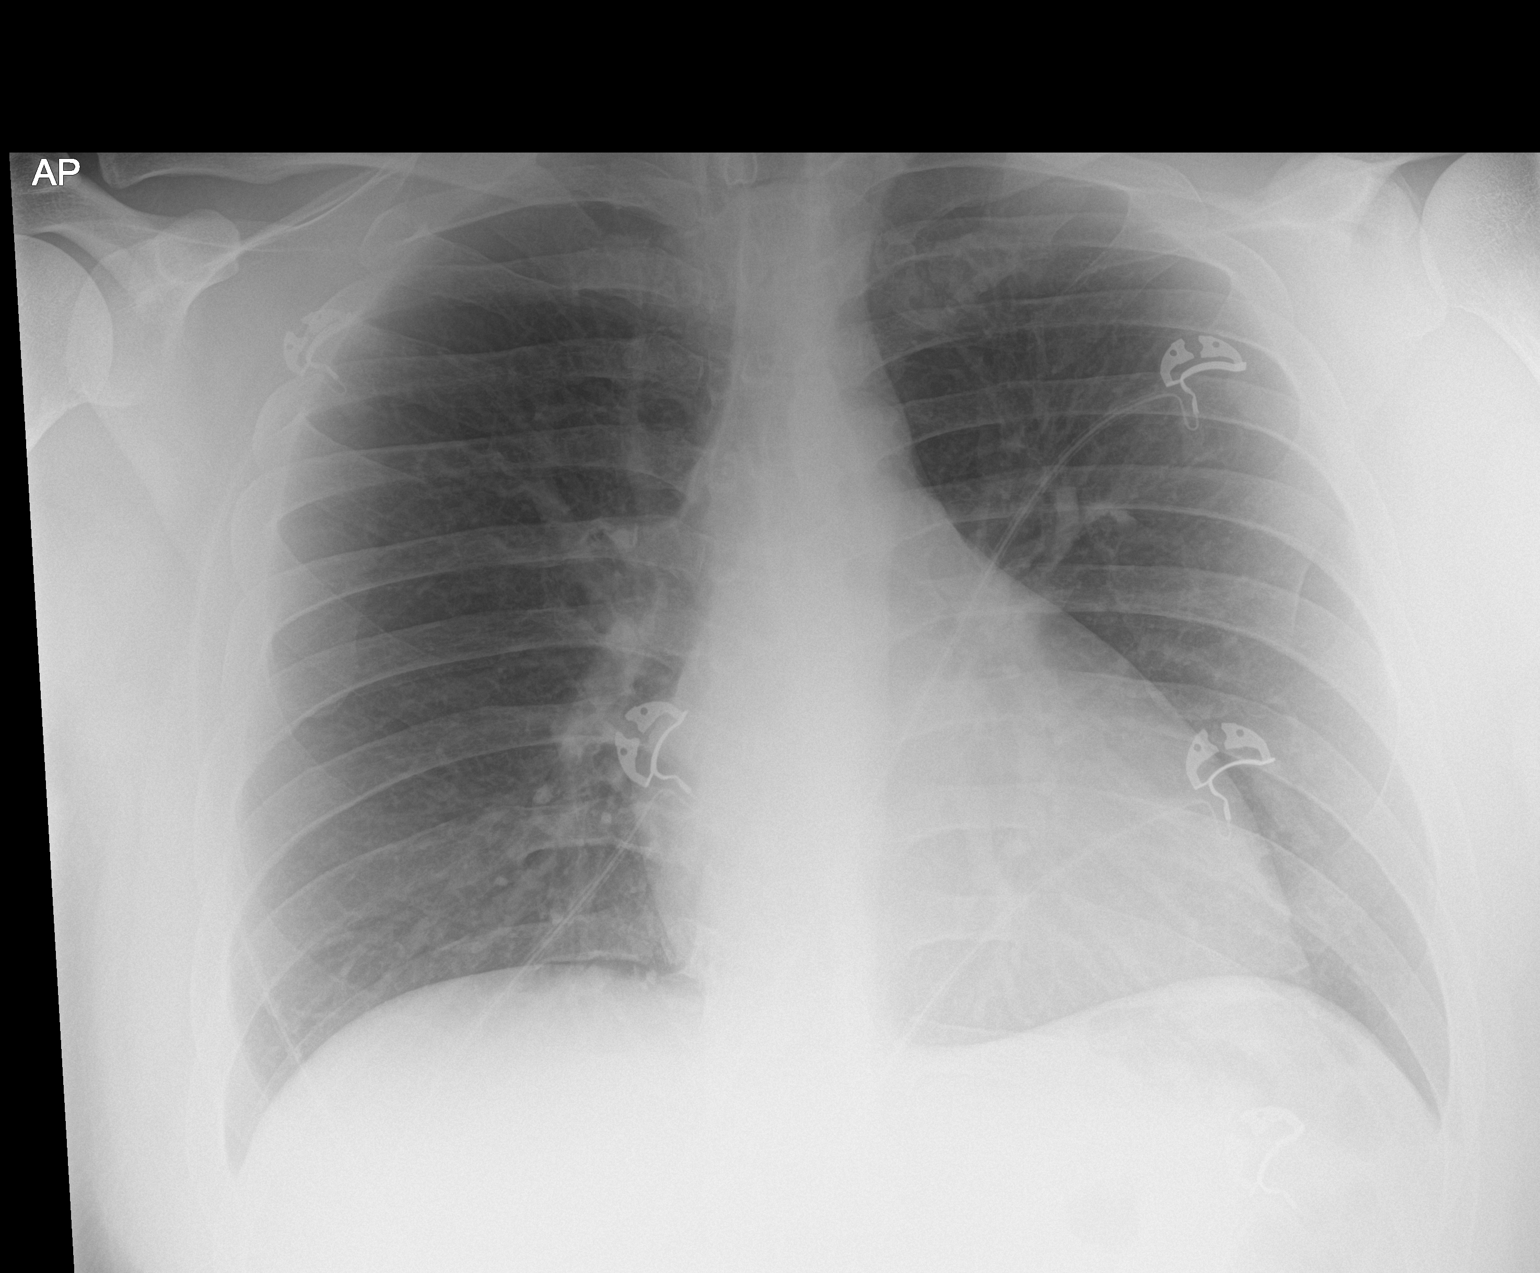

[1 of 1 positions shown; findings below may reference images not displayed]

FINDINGS: The heart size and mediastinal contours are within normal limits.
Both lungs are clear. The visualized skeletal structures are
unremarkable.
IMPRESSION: No active disease.

## 2023-09-27 ENCOUNTER — Emergency Department (HOSPITAL_BASED_OUTPATIENT_CLINIC_OR_DEPARTMENT_OTHER)
Admission: EM | Admit: 2023-09-27 | Discharge: 2023-09-27 | Disposition: A | Discharge status: Home / Self Care | Payer: Self-pay | Attending: Emergency Medicine | Admitting: Emergency Medicine

## 2023-09-27 ENCOUNTER — Other Ambulatory Visit: Payer: Self-pay

## 2023-09-27 ENCOUNTER — Encounter (HOSPITAL_BASED_OUTPATIENT_CLINIC_OR_DEPARTMENT_OTHER): Payer: Self-pay | Admitting: Emergency Medicine

## 2023-09-27 DIAGNOSIS — L52 Erythema nodosum: Secondary | ICD-10-CM | POA: Insufficient documentation

## 2023-09-27 DIAGNOSIS — J45909 Unspecified asthma, uncomplicated: Secondary | ICD-10-CM | POA: Insufficient documentation

## 2023-09-27 LAB — CBC WITH DIFFERENTIAL/PLATELET
Abs Immature Granulocytes: 0.03 K/uL (ref 0.00–0.07)
Basophils Absolute: 0.1 K/uL (ref 0.0–0.1)
Basophils Relative: 1 %
Eosinophils Absolute: 0.1 K/uL (ref 0.0–0.5)
Eosinophils Relative: 1 %
HCT: 42.1 % (ref 39.0–52.0)
Hemoglobin: 14.3 g/dL (ref 13.0–17.0)
Immature Granulocytes: 0 %
Lymphocytes Relative: 34 %
Lymphs Abs: 2.8 K/uL (ref 0.7–4.0)
MCH: 29.7 pg (ref 26.0–34.0)
MCHC: 34 g/dL (ref 30.0–36.0)
MCV: 87.3 fL (ref 80.0–100.0)
Monocytes Absolute: 1.4 K/uL — ABNORMAL HIGH (ref 0.1–1.0)
Monocytes Relative: 16 %
Neutro Abs: 4 K/uL (ref 1.7–7.7)
Neutrophils Relative %: 48 %
Platelets: 300 K/uL (ref 150–400)
RBC: 4.82 MIL/uL (ref 4.22–5.81)
RDW: 13.2 % (ref 11.5–15.5)
WBC: 8.4 K/uL (ref 4.0–10.5)
nRBC: 0 % (ref 0.0–0.2)

## 2023-09-27 LAB — BASIC METABOLIC PANEL WITH GFR
Anion gap: 14 (ref 5–15)
BUN: 14 mg/dL (ref 6–20)
CO2: 22 mmol/L (ref 22–32)
Calcium: 9.8 mg/dL (ref 8.9–10.3)
Chloride: 100 mmol/L (ref 98–111)
Creatinine, Ser: 0.98 mg/dL (ref 0.61–1.24)
GFR, Estimated: >60 mL/min (ref >60)
Glucose, Bld: 78 mg/dL (ref 70–99)
Potassium: 4.1 mmol/L (ref 3.5–5.1)
Sodium: 136 mmol/L (ref 135–145)

## 2023-09-27 MED ORDER — IBUPROFEN 600 MG PO TABS: 600.0000 mg | ORAL_TABLET | Freq: Four times a day (QID) | ORAL | 0 refills | Status: AC | PRN

## 2023-09-27 NOTE — ED Provider Notes (Signed)
 [place] [facility] Provider Note   CSN: [id number] Arrival date & time: 09/27/23  1401     Patient presents with: Rash   Mynor Stitely is a 33 y.o. male with a past medical history significant for asthma and seasonal allergies who presents to the ED due to painful bumps to bilateral lower extremities x 4 days.  No new medications or products.  Denies fever or chills.  No recent illness.  Has been taking Benadryl and ibuprofen  with no relief in bumps.  No injury to lower extremities. Denies shortness of breath.   History obtained from patient and past medical records. No interpreter used during encounter.      Prior to Admission medications   Medication Sig Start Date End Date Taking? Authorizing Provider  ibuprofen  (ADVIL ) 600 MG tablet Take 1 tablet (600 mg total) by mouth every 6 (six) hours as needed. 09/27/23  Yes Lycan Davee C, PA-C  cyclobenzaprine  (FLEXERIL ) 10 MG tablet Take 1 tablet (10 mg total) by mouth 2 (two) times daily as needed for muscle spasms. 05/23/22   Yolande Lamar BROCKS, MD  doxycycline  (VIBRAMYCIN ) 100 MG capsule Take 1 capsule (100 mg total) by mouth 2 (two) times daily. 08/21/19   Rolinda Rogue, MD  HYDROcodone -acetaminophen  (NORCO) 5-325 MG tablet Take 1 tablet by mouth every 6 (six) hours as needed for severe pain. 02/18/21   Elnor Bernarda SQUIBB, DO  ibuprofen  (ADVIL ) 600 MG tablet Take 1 tablet (600 mg total) by mouth every 6 (six) hours as needed for mild pain. 02/18/21   Elnor Bernarda P, DO  methocarbamol  (ROBAXIN ) 500 MG tablet Take 1 tablet (500 mg total) by mouth every 8 (eight) hours as needed. 06/28/17   Petrucelli, Samantha R, PA-C  naproxen  (NAPROSYN ) 500 MG tablet Take 1 tablet (500 mg total) by mouth 2 (two) times daily. 06/28/17   Petrucelli, Samantha R, PA-C  ondansetron  (ZOFRAN ) 4 MG tablet Take 1 tablet (4 mg total) by mouth every 6 (six) hours. 09/22/21   Elnor Bernarda SQUIBB, DO    Allergies: Patient has no known  allergies.    Review of Systems  Skin:  Positive for color change and rash.    Updated Vital Signs BP (!) 145/91 (BP Location: Left Arm)   Pulse 79   Temp 98.1 F (36.7 C) (Oral)   Resp 16   Wt 104.8 kg   SpO2 100%   BMI 33.15 kg/m   Physical Exam Vitals and nursing note reviewed.  Constitutional:      General: He is not in acute distress.    Appearance: He is not ill-appearing.  HENT:     Head: Normocephalic.  Eyes:     Pupils: Pupils are equal, round, and reactive to light.  Cardiovascular:     Rate and Rhythm: Normal rate and regular rhythm.     Pulses: Normal pulses.     Heart sounds: Normal heart sounds. No murmur heard.    No friction rub. No gallop.  Pulmonary:     Effort: Pulmonary effort is normal.     Breath sounds: Normal breath sounds.  Abdominal:     General: Abdomen is flat. There is no distension.     Palpations: Abdomen is soft.     Tenderness: There is no abdominal tenderness. There is no guarding or rebound.  Musculoskeletal:        General: Normal range of motion.     Cervical back: Neck supple.  Skin:  General: Skin is warm and dry.     Comments: Multiple nodules to lower extremities. See photos below.   Neurological:     General: No focal deficit present.     Mental Status: He is alert.  Psychiatric:        Mood and Affect: Mood normal.        Behavior: Behavior normal.        (all labs ordered are listed, but only abnormal results are displayed) Labs Reviewed  CBC WITH DIFFERENTIAL/PLATELET - Abnormal; Notable for the following components:      Result Value   Monocytes Absolute 1.4 (*)    All other components within normal limits  BASIC METABOLIC PANEL WITH GFR    EKG: None  Radiology: No results found.   Procedures   Medications Ordered in the ED - No data to display                                  Medical Decision Making Amount and/or Complexity of Data Reviewed Labs: ordered. Decision-making details  documented in ED Course.  Risk Prescription drug management.   33 year old male presents to the ED due to nodules to bilateral lower extremities x 4 days.  No fever or chills.  No injury.  Upon arrival, stable vitals.  Patient in no acute distress.  Patient does have warm nodules to bilateral lower extremities as noted above consistent with erythema nodosum. No evidence of superimposed bacterial infection. Labs are reassuring.  No leukocytosis.  BMP with normal renal function.  No major electrolyte derangements. Patient discharged with ibuprofen .  Information on skin condition given to patient at discharge.  Patient stable for discharge. Strict ED precautions discussed with patient. Patient states understanding and agrees to plan. Patient discharged home in no acute distress and stable vitals  No PCP Hx ashtma    Final diagnoses:  Erythema nodosum    ED Discharge Orders          Ordered    ibuprofen  (ADVIL ) 600 MG tablet  Every 6 hours PRN        09/27/23 1645               Arthor Gorter C, PA-C 09/27/23 1656    Zackowski, Scott, MD 09/28/23 1641

## 2023-09-27 NOTE — ED Notes (Signed)
 Reviewed discharge instructions, follow up and medications with pt. Pt states understanding. Ambulatory at discharge

## 2023-09-27 NOTE — ED Triage Notes (Signed)
 Rash / red spots bilateral lower legs , non itching x 4 days . Took benadryl yesterday with some relief .  Works in [employer] . No outdoor activity.

## 2023-09-27 NOTE — Discharge Instructions (Addendum)
 It was a pleasure taking care of you today.  As discussed, I suspect your rash is related to erythema nodosum. It typically resolves in 3-6 weeks. Continue to take ibuprofen . Return to the ER for new or worsening symptoms.

## 2023-10-04 ENCOUNTER — Other Ambulatory Visit: Payer: Self-pay

## 2023-10-04 ENCOUNTER — Emergency Department (HOSPITAL_BASED_OUTPATIENT_CLINIC_OR_DEPARTMENT_OTHER)
Admission: EM | Admit: 2023-10-04 | Discharge: 2023-10-04 | Disposition: A | Discharge status: Home / Self Care | Payer: Self-pay | Attending: Emergency Medicine | Admitting: Emergency Medicine

## 2023-10-04 ENCOUNTER — Encounter (HOSPITAL_BASED_OUTPATIENT_CLINIC_OR_DEPARTMENT_OTHER): Payer: Self-pay

## 2023-10-04 DIAGNOSIS — L03116 Cellulitis of left lower limb: Secondary | ICD-10-CM | POA: Insufficient documentation

## 2023-10-04 DIAGNOSIS — L03115 Cellulitis of right lower limb: Secondary | ICD-10-CM | POA: Insufficient documentation

## 2023-10-04 DIAGNOSIS — J45909 Unspecified asthma, uncomplicated: Secondary | ICD-10-CM | POA: Insufficient documentation

## 2023-10-04 DIAGNOSIS — L03119 Cellulitis of unspecified part of limb: Secondary | ICD-10-CM

## 2023-10-04 MED ORDER — DOXYCYCLINE HYCLATE 100 MG PO CAPS: 100.0000 mg | ORAL_CAPSULE | Freq: Two times a day (BID) | ORAL | 0 refills | Status: AC

## 2023-10-04 MED ORDER — DOXYCYCLINE HYCLATE 100 MG PO TABS
100.0000 mg | ORAL_TABLET | Freq: Once | ORAL | Status: AC
  Administered 2023-10-04: 100 mg via ORAL
  Filled 2023-10-04: qty 1

## 2023-10-04 NOTE — Discharge Instructions (Addendum)
 Thank you for letting us  evaluate you today.  It appears that you may have a bacterial skin infection of both legs.  I have sent an antibiotic to your pharmacy.  Please pick this up and take as prescribed and do not miss a dose even if you are feeling better.  Continue new elevating legs, using ibuprofen , cool compresses.  You may use Tylenol  as needed for fever greater than 100.4 F  I have also provided you with primary care provider recommendation.  Please establish care with a primary care provider for routine medical complaints, annual visits, annual lab work  Return to Emergency Department if you experience worsening swelling, pain, fever greater than 100.4 F that does not improve with Tylenol , chest pain, shortness of breath

## 2023-10-04 NOTE — ED Triage Notes (Addendum)
 Pt reports being seen on 3rd here for rash/pain in bilateral legs. Pt prescribed ibuprofen  for pain. PT reports pain/swelling has gradually worsened since. In triage, legs are noticeably swollen bilaterally. No redness/rash noted. Pt states he's on his feet all day for work. Pt denies any improvement in the evening when he's off his feet.

## 2023-10-04 NOTE — ED Notes (Signed)
 Patient Alert and oriented to baseline. Stable and ambulatory to baseline. Patient verbalized understanding of the discharge instructions.  Patient belongings were taken by the patient.

## 2023-10-04 NOTE — ED Provider Notes (Signed)
 [place] [facility] Provider Note   CSN: [id number] Arrival date & time: 10/04/23  1305     Patient presents with: Leg Pain   Stephen Perry is a 33 y.o. male with a past medical history of asthma presents to emergency department for evaluation of bilateral leg swelling.  Was seen here on 09/27/2023 and diagnosed with erythema nodosum.  Has been using compression socks, ibuprofen , elevating legs and symptoms have become more swollen, red, erythematous.  He is a very physically demanding job and is on his feet all day for work. Legs are tender to touch and hurt when ambulating. He denies sore throat, cough, infectious symptoms, fever, recent travel/surgery, history of PE, DVT, hemoptysis, exogenous hormone use, shob, cp, changes in medications, new detergents    Leg Pain      Prior to Admission medications   Medication Sig Start Date End Date Taking? Authorizing Provider  doxycycline  (VIBRAMYCIN ) 100 MG capsule Take 1 capsule (100 mg total) by mouth 2 (two) times daily for 10 days. 10/04/23 10/14/23 Yes Minnie Tinnie BRAVO, PA  cyclobenzaprine  (FLEXERIL ) 10 MG tablet Take 1 tablet (10 mg total) by mouth 2 (two) times daily as needed for muscle spasms. 05/23/22   Yolande Lamar BROCKS, MD  doxycycline  (VIBRAMYCIN ) 100 MG capsule Take 1 capsule (100 mg total) by mouth 2 (two) times daily. 08/21/19   Rolinda Rogue, MD  HYDROcodone -acetaminophen  (NORCO) 5-325 MG tablet Take 1 tablet by mouth every 6 (six) hours as needed for severe pain. 02/18/21   Elnor Hila P, DO  ibuprofen  (ADVIL ) 600 MG tablet Take 1 tablet (600 mg total) by mouth every 6 (six) hours as needed for mild pain. 02/18/21   Elnor Hila P, DO  ibuprofen  (ADVIL ) 600 MG tablet Take 1 tablet (600 mg total) by mouth every 6 (six) hours as needed. 09/27/23   Aberman, Caroline C, PA-C  methocarbamol  (ROBAXIN ) 500 MG tablet Take 1 tablet (500 mg total) by mouth every 8 (eight) hours as needed. 06/28/17    Petrucelli, Samantha R, PA-C  naproxen  (NAPROSYN ) 500 MG tablet Take 1 tablet (500 mg total) by mouth 2 (two) times daily. 06/28/17   Petrucelli, Samantha R, PA-C  ondansetron  (ZOFRAN ) 4 MG tablet Take 1 tablet (4 mg total) by mouth every 6 (six) hours. 09/22/21   Elnor Hila SQUIBB, DO    Allergies: Patient has no known allergies.    Review of Systems  Skin:  Positive for color change.    Updated Vital Signs BP 133/83 (BP Location: Left Arm)   Pulse 91   Temp 99.2 F (37.3 C) (Oral)   Resp 16   Ht 5' 11 (1.803 m)   Wt 104.8 kg   SpO2 100%   BMI 32.22 kg/m   Physical Exam Vitals and nursing note reviewed.  Constitutional:      General: He is not in acute distress.    Appearance: Normal appearance.  HENT:     Head: Normocephalic and atraumatic.  Eyes:     Conjunctiva/sclera: Conjunctivae normal.  Cardiovascular:     Rate and Rhythm: Normal rate.     Pulses:          Dorsalis pedis pulses are 2+ on the right side and 2+ on the left side.  Pulmonary:     Effort: Pulmonary effort is normal. No respiratory distress.  Musculoskeletal:     Right lower leg: 1+ Pitting Edema present.     Left lower leg: 1+ Pitting Edema  present.  Skin:    Coloration: Skin is not jaundiced or pale.     Comments: No obvious injury or break in skin integrity to legs or feet bilaterally  Neurological:     Mental Status: He is alert. Mental status is at baseline.     Comments: Motor 5/5 and sensation 2/2 of BLE     (all labs ordered are listed, but only abnormal results are displayed) Labs Reviewed - No data to display  EKG: None  Radiology: No results found.   Medications Ordered in the ED  doxycycline  (VIBRA -TABS) tablet 100 mg (100 mg Oral Given 10/04/23 1451)                                    Medical Decision Making Risk Prescription drug management.    Patient presents to the ED for concern of bilateral leg edema, erythema, tenderness, this involves an extensive number of  treatment options, and is a complaint that carries with it a high risk of complications and morbidity.  The differential diagnosis includes DVT, cellulitis,   Co morbidities that complicate the patient evaluation  asthma   Additional history obtained:  Additional history obtained from Nursing and Outside Medical Records   External records from outside source obtained and reviewed including triage RN note, ED note in media from 09/27/2023    Medicines ordered and prescription drug management:  I ordered medication including doxy  for cellulitis  Reevaluation of the patient after these medicines showed that the patient stayed the same I have reviewed the patients home medicines and have made adjustments as needed     Problem List / ED Course:  Bilateral lower extremity erythema, warmth, tenderness Per pictures on 09/27/2023, patient presented with tender mildly erythematous nodules but no significant swelling nor signs of superimposed bacterial infection At that time had no leukocytosis nor fever nor tachycardia Today, legs are significantly more swollen with 1+ pitting edema bilaterally.  Entirety of leg is erythematous and warm to touch.  Legs are well-perfused with easily palpable DP pulses 2+ bilaterally.  Motor and neuro intact. No tachycardia however borderline temperature of 99.2 F No hx of DM nor MRSA Low suspicion for DVT as swelling is bilateral and was preceded by mildly erythematous and tender nodules.  He has no risk factors for DVT, PE and is PERC negative. Low suspicion of allergic reaction as patient denies pruritus to legs.  Rash appears more cellulitic in nature versus urticaria.  No shortness of breath or focusing sensation.  He denies any new medications, detergents nor does he have a history of allergic reaction, allergy that he knows of Although I do not see precipitating injury, laceration, etc to cause cellulitis, it seems that he may have cellulitis based on  presentation. Will provide one dose of doxy in ED and 10 day prescription  Discussed antibiotic stewardship Discussed symptomatic treatment at home    Reevaluation:  After the interventions noted above, I reevaluated the patient and found that they have :stayed the same   Social Determinants of Health:  Former tobacco abuse No PCP on file-provided recommendation   Dispostion:  After consideration of the diagnostic results and the patients response to treatment, I feel that the patent would benefit from outpatient management with doxycycline  for suspected cellulitis.   Discussed ED workup, disposition, return to ED precautions with patient who expresses understanding agrees with plan.  All questions answered  to their satisfaction.  They are agreeable to plan.  Discharge instructions provided on paperwork  Dr. Mannie individually assessed patient and agrees with plan  Final diagnoses:  Cellulitis of lower extremity, unspecified laterality    ED Discharge Orders          Ordered    doxycycline  (VIBRAMYCIN ) 100 MG capsule  2 times daily        10/04/23 1448             Minnie Tinnie BRAVO, PA 10/04/23 1455    Mannie Pac T, DO 10/08/23 1514
